# Patient Record
Sex: Female | Born: 1965 | Race: Black or African American | Hispanic: No | Marital: Single | Smoking: Never smoker
Health system: Southern US, Community
[De-identification: ages and names within clinical notes are randomized; demographics above are authoritative.]

## PROBLEM LIST (undated history)

## (undated) DIAGNOSIS — D649 Anemia, unspecified: Secondary | ICD-10-CM

## (undated) DIAGNOSIS — Z923 Personal history of irradiation: Secondary | ICD-10-CM

## (undated) DIAGNOSIS — D259 Leiomyoma of uterus, unspecified: Secondary | ICD-10-CM

## (undated) HISTORY — DX: Leiomyoma of uterus, unspecified: D25.9

## (undated) HISTORY — PX: DIAGNOSTIC LAPAROSCOPY: SUR761

## (undated) HISTORY — PX: BREAST LUMPECTOMY: SHX2

---

## 2001-08-06 HISTORY — PX: UTERINE FIBROID SURGERY: SHX826

## 2006-10-24 ENCOUNTER — Encounter: Admission: RE | Admit: 2006-10-24 | Discharge: 2006-10-24 | Payer: Self-pay | Admitting: Obstetrics and Gynecology

## 2007-07-17 ENCOUNTER — Ambulatory Visit: Payer: Self-pay | Admitting: Family Medicine

## 2007-07-17 ENCOUNTER — Other Ambulatory Visit: Admission: RE | Admit: 2007-07-17 | Discharge: 2007-07-17 | Payer: Self-pay | Admitting: Family Medicine

## 2007-07-17 ENCOUNTER — Encounter: Payer: Self-pay | Admitting: Family Medicine

## 2007-07-18 ENCOUNTER — Encounter: Payer: Self-pay | Admitting: Family Medicine

## 2007-07-18 LAB — CONVERTED CEMR LAB
ALT: 9 units/L (ref 0–35)
AST: 14 units/L (ref 0–37)
Albumin: 4.5 g/dL (ref 3.5–5.2)
Alkaline Phosphatase: 51 units/L (ref 39–117)
BUN: 15 mg/dL (ref 6–23)
HDL: 41 mg/dL (ref >39)
LDL Cholesterol: 86 mg/dL (ref 0–99)
Potassium: 4.6 meq/L (ref 3.5–5.3)
TSH: 0.898 microintl units/mL (ref 0.350–5.50)
Total CHOL/HDL Ratio: 3.5

## 2007-07-19 ENCOUNTER — Encounter: Payer: Self-pay | Admitting: Family Medicine

## 2007-07-23 LAB — CONVERTED CEMR LAB: Pap Smear: NORMAL

## 2007-09-30 ENCOUNTER — Encounter: Payer: Self-pay | Admitting: Family Medicine

## 2007-11-12 ENCOUNTER — Encounter: Admission: RE | Admit: 2007-11-12 | Discharge: 2007-11-12 | Payer: Self-pay | Admitting: Family Medicine

## 2008-11-16 ENCOUNTER — Encounter: Admission: RE | Admit: 2008-11-16 | Discharge: 2008-11-16 | Payer: Self-pay | Admitting: Family Medicine

## 2009-12-01 ENCOUNTER — Encounter: Admission: RE | Admit: 2009-12-01 | Discharge: 2009-12-01 | Payer: Self-pay | Admitting: Family Medicine

## 2010-11-01 ENCOUNTER — Encounter: Payer: Self-pay | Admitting: Family Medicine

## 2010-11-01 ENCOUNTER — Other Ambulatory Visit: Payer: Self-pay | Admitting: Family Medicine

## 2010-11-01 DIAGNOSIS — Z1231 Encounter for screening mammogram for malignant neoplasm of breast: Secondary | ICD-10-CM

## 2010-11-10 ENCOUNTER — Encounter: Payer: Self-pay | Admitting: Family Medicine

## 2010-11-10 ENCOUNTER — Ambulatory Visit (INDEPENDENT_AMBULATORY_CARE_PROVIDER_SITE_OTHER): Payer: 59 | Admitting: Family Medicine

## 2010-11-10 ENCOUNTER — Other Ambulatory Visit (HOSPITAL_COMMUNITY)
Admission: RE | Admit: 2010-11-10 | Discharge: 2010-11-10 | Disposition: A | Discharge status: Home / Self Care | Payer: 59 | Source: Ambulatory Visit | Attending: Family Medicine | Admitting: Family Medicine

## 2010-11-10 VITALS — BP 100/57 | HR 67 | Wt 143.0 lb

## 2010-11-10 DIAGNOSIS — Z862 Personal history of diseases of the blood and blood-forming organs and certain disorders involving the immune mechanism: Secondary | ICD-10-CM

## 2010-11-10 DIAGNOSIS — Z01419 Encounter for gynecological examination (general) (routine) without abnormal findings: Secondary | ICD-10-CM | POA: Insufficient documentation

## 2010-11-10 DIAGNOSIS — Z8639 Personal history of other endocrine, nutritional and metabolic disease: Secondary | ICD-10-CM

## 2010-11-10 DIAGNOSIS — Z23 Encounter for immunization: Secondary | ICD-10-CM

## 2010-11-10 DIAGNOSIS — Z1159 Encounter for screening for other viral diseases: Secondary | ICD-10-CM | POA: Insufficient documentation

## 2010-11-10 NOTE — Progress Notes (Signed)
  Subjective:     Jasmine Good is a 45 y.o. female and is here for a comprehensive physical exam. The patient reports no problems. Has mammo scheduled 12/06/2010.   History   Social History  . Marital Status: Single    Spouse Name: N/A    Number of Children: N/A  . Years of Education: N/A   Occupational History  . 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 Smoking status: Not on file  . Smokeless tobacco: Not on file  . Alcohol Use: Not on file  . Drug Use: Not on file  . Sexually Active: Not Currently   Other Topics Concern  . Not on file   Social History Narrative   No regular exercise.    Health Maintenance  Topic Date Due  . Influenza Vaccine  11/07/2010  . Pap Smear  11/09/2013  . Tetanus/tdap  07/16/2017    The following portions of the patient's history were reviewed and updated as appropriate: allergies, current medications, past family history, past medical history, past social history and past surgical history.  Review of Systems A comprehensive review of systems was negative.   Objective:    BP 100/57  Pulse 67  Wt 143 lb (64.864 kg) General appearance: alert, cooperative and appears stated age Head: Normocephalic, without obvious abnormality, atraumatic Eyes: conjunctiva clear, EOMI, PEERLA Ears: normal TM's and external ear canals both ears Nose: Nares normal. Septum midline. Mucosa normal. No drainage or sinus tenderness. Throat: lips, mucosa, and tongue normal; teeth and gums normal Neck: no adenopathy, no carotid bruit, supple, symmetrical, trachea midline and thyroid not enlarged, symmetric, no tenderness/mass/nodules Back: symmetric, no curvature. ROM normal. No CVA tenderness. Lungs: clear to auscultation bilaterally Breasts: normal appearance, no masses or tenderness Heart: regular rate and rhythm, S1, S2 normal, no murmur, click, rub or gallop Abdomen: soft, non-tender; bowel sounds normal; no masses,  no  organomegaly Pelvic: cervix normal in appearance, external genitalia normal, no adnexal masses or tenderness, no cervical motion tenderness, rectovaginal septum normal, uterus normal size, shape, and consistency and vagina normal without discharge Extremities: extremities normal, atraumatic, no cyanosis or edema Pulses: 2+ and symmetric Skin: Skin color, texture, turgor normal. No rashes or lesions Lymph nodes: Cervical, supraclavicular, and axillary nodes normal. Neurologic: Grossly normal    Assessment:    Healthy female exam.      Plan:     See After Visit Summary for Counseling Recommendations  Start a regular exercise program and make sure you are eating a healthy diet Try to eat 4 servings of dairy a day or take a calcium supplement (500mg  twice a day). Your vaccines are up to date.  Given lab slip for screening labs Given flu shot today. Prior hx of anemia so will check CBC as well.  Will call with pap results.

## 2010-11-10 NOTE — Patient Instructions (Signed)
Start a regular exercise program and make sure you are eating a healthy diet Try to eat 4 servings of dairy a day or take a calcium supplement (500mg twice a day). Your vaccines are up to date.   

## 2010-11-11 ENCOUNTER — Telehealth: Payer: Self-pay | Admitting: *Deleted

## 2010-11-11 LAB — COMPLETE METABOLIC PANEL WITH GFR
AST: 18 U/L (ref 0–37)
Alkaline Phosphatase: 46 U/L (ref 39–117)
BUN: 11 mg/dL (ref 6–23)
Creat: 0.55 mg/dL (ref 0.50–1.10)
Potassium: 4.3 mEq/L (ref 3.5–5.3)

## 2010-11-11 LAB — LIPID PANEL
Cholesterol: 148 mg/dL (ref 0–200)
Total CHOL/HDL Ratio: 3.4 Ratio
Triglycerides: 62 mg/dL (ref <150)
VLDL: 12 mg/dL (ref 0–40)

## 2010-11-11 NOTE — Telephone Encounter (Signed)
Message copied by Wyline Beady on Fri Nov 11, 2010  9:04 AM ------      Message from: Nani Gasser D      Created: Fri Nov 11, 2010  7:42 AM       Complete metabolic panel and cholesterol looked great. Iron also is normal. Hemoglobin is normal.

## 2010-11-11 NOTE — Telephone Encounter (Signed)
Left message on vm

## 2010-12-06 ENCOUNTER — Ambulatory Visit
Admission: RE | Admit: 2010-12-06 | Discharge: 2010-12-06 | Disposition: A | Discharge status: Home / Self Care | Payer: 59 | Source: Ambulatory Visit | Attending: Family Medicine | Admitting: Family Medicine

## 2010-12-06 DIAGNOSIS — Z1231 Encounter for screening mammogram for malignant neoplasm of breast: Secondary | ICD-10-CM

## 2011-11-07 ENCOUNTER — Other Ambulatory Visit: Payer: Self-pay | Admitting: Family Medicine

## 2011-11-07 DIAGNOSIS — Z9289 Personal history of other medical treatment: Secondary | ICD-10-CM

## 2011-11-09 ENCOUNTER — Ambulatory Visit (INDEPENDENT_AMBULATORY_CARE_PROVIDER_SITE_OTHER): Payer: 59

## 2011-11-09 DIAGNOSIS — Z9289 Personal history of other medical treatment: Secondary | ICD-10-CM

## 2011-11-09 DIAGNOSIS — Z1231 Encounter for screening mammogram for malignant neoplasm of breast: Secondary | ICD-10-CM

## 2011-12-07 ENCOUNTER — Encounter: Payer: Self-pay | Admitting: Family Medicine

## 2011-12-07 ENCOUNTER — Ambulatory Visit (INDEPENDENT_AMBULATORY_CARE_PROVIDER_SITE_OTHER): Payer: 59 | Admitting: Family Medicine

## 2011-12-07 VITALS — BP 100/61 | HR 74 | Ht 66.5 in | Wt 145.0 lb

## 2011-12-07 DIAGNOSIS — Z0000 Encounter for general adult medical examination without abnormal findings: Secondary | ICD-10-CM

## 2011-12-07 NOTE — Patient Instructions (Addendum)
Start a regular exercise program and make sure you are eating a healthy diet Try to eat 4 servings of dairy a day  Your vaccines are up to date.   

## 2011-12-07 NOTE — Progress Notes (Signed)
  Subjective:     Jasmine Good is a 46 y.o. female and is here for a comprehensive physical exam. The patient reports no problems.  History   Social History  . Marital Status: Single    Spouse Name: N/A    Number of Children: N/A  . Years of Education: N/A   Occupational History  . 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 Smoking status: Never Smoker   . Smokeless tobacco: Not on file  . Alcohol Use: Not on file  . Drug Use: Not on file  . Sexually Active: Not Currently   Other Topics Concern  . Not on file   Social History Narrative   No regular exercise.    Health Maintenance  Topic Date Due  . Influenza Vaccine  10/07/2012  . Pap Smear  11/09/2013  . Tetanus/tdap  07/16/2017    The following portions of the patient's history were reviewed and updated as appropriate: allergies, current medications, past family history, past medical history, past social history, past surgical history and problem list.  Review of Systems A comprehensive review of systems was negative.   Objective:    BP 100/61  Pulse 74  Ht 5' 6.5" (1.689 m)  Wt 145 lb (65.772 kg)  BMI 23.05 kg/m2  LMP 11/03/2011 General appearance: alert, cooperative and appears stated age Head: Normocephalic, without obvious abnormality, atraumatic Eyes: conj clear, EOMI, PEERLA Ears: normal TM's and external ear canals both ears Nose: Nares normal. Septum midline. Mucosa normal. No drainage or sinus tenderness. Throat: lips, mucosa, and tongue normal; teeth and gums normal Neck: no adenopathy, no carotid bruit, no JVD, supple, symmetrical, trachea midline and thyroid not enlarged, symmetric, no tenderness/mass/nodules Back: symmetric, no curvature. ROM normal. No CVA tenderness. Lungs: clear to auscultation bilaterally Heart: regular rate and rhythm, S1, S2 normal, no murmur, click, rub or gallop Abdomen: soft, non-tender; bowel sounds normal; no masses,  no  organomegaly Extremities: extremities normal, atraumatic, no cyanosis or edema Pulses: 2+ and symmetric Skin: Skin color, texture, turgor normal. No rashes or lesions Lymph nodes: Cervical, supraclavicular, and axillary nodes normal. Neurologic: Alert and oriented X 3, normal strength and tone. Normal symmetric reflexes. Normal coordination and gait    Assessment:    Healthy female exam.      Plan:     See After Visit Summary for Counseling Recommendations  Start a regular exercise program and make sure you are eating a healthy diet Try to eat 4 servings of dairy a day Your vaccines are up to date.  Given slip for CMP and lipids.   Mammogram is up to date.

## 2011-12-08 LAB — LIPID PANEL
Cholesterol: 149 mg/dL (ref 0–200)
HDL: 43 mg/dL (ref >39)
Triglycerides: 43 mg/dL (ref <150)

## 2011-12-08 LAB — COMPLETE METABOLIC PANEL WITH GFR
BUN: 16 mg/dL (ref 6–23)
CO2: 26 mEq/L (ref 19–32)
Creat: 0.48 mg/dL — ABNORMAL LOW (ref 0.50–1.10)
GFR, Est African American: >89 mL/min
GFR, Est Non African American: >89 mL/min
Glucose, Bld: 77 mg/dL (ref 70–99)
Total Bilirubin: 0.4 mg/dL (ref 0.3–1.2)

## 2011-12-08 NOTE — Progress Notes (Signed)
Quick Note:  All labs are normal. ______ 

## 2012-11-04 ENCOUNTER — Other Ambulatory Visit: Payer: Self-pay | Admitting: Family Medicine

## 2012-11-04 DIAGNOSIS — Z1231 Encounter for screening mammogram for malignant neoplasm of breast: Secondary | ICD-10-CM

## 2012-11-12 ENCOUNTER — Ambulatory Visit (INDEPENDENT_AMBULATORY_CARE_PROVIDER_SITE_OTHER): Payer: 59

## 2012-11-12 DIAGNOSIS — Z1231 Encounter for screening mammogram for malignant neoplasm of breast: Secondary | ICD-10-CM

## 2013-01-06 ENCOUNTER — Encounter: Payer: Self-pay | Admitting: Family Medicine

## 2013-01-06 ENCOUNTER — Ambulatory Visit (INDEPENDENT_AMBULATORY_CARE_PROVIDER_SITE_OTHER): Payer: 59 | Admitting: Family Medicine

## 2013-01-06 VITALS — BP 105/62 | HR 85 | Temp 97.6°F | Ht 66.6 in | Wt 147.0 lb

## 2013-01-06 DIAGNOSIS — Z23 Encounter for immunization: Secondary | ICD-10-CM

## 2013-01-06 DIAGNOSIS — Z0000 Encounter for general adult medical examination without abnormal findings: Secondary | ICD-10-CM

## 2013-01-06 LAB — COMPLETE METABOLIC PANEL WITH GFR
CO2: 25 mEq/L (ref 19–32)
GFR, Est African American: >89 mL/min
GFR, Est Non African American: >89 mL/min
Glucose, Bld: 86 mg/dL (ref 70–99)
Sodium: 136 mEq/L (ref 135–145)
Total Bilirubin: 0.4 mg/dL (ref 0.3–1.2)
Total Protein: 6.7 g/dL (ref 6.0–8.3)

## 2013-01-06 LAB — LIPID PANEL: HDL: 41 mg/dL (ref >39)

## 2013-01-06 NOTE — Progress Notes (Signed)
  Subjective:     Jasmine Good is a 46 y.o. female and is here for a comprehensive physical exam. The patient reports no problems.  History   Social History  . Marital Status: Single    Spouse Name: N/A    Number of Children: 0  . Years of Education: N/A   Occupational History  . 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 Smoking status: Never Smoker   . Smokeless tobacco: Not on file  . Alcohol Use: Not on file  . Drug Use: Not on file  . Sexual Activity: Not Currently   Other Topics Concern  . Not on file   Social History Narrative   Some regular exercise.    Health Maintenance  Topic Date Due  . Influenza Vaccine  09/06/2013  . Pap Smear  11/09/2013  . Tetanus/tdap  07/16/2017    The following portions of the patient's history were reviewed and updated as appropriate: allergies, current medications, past family history, past medical history, past social history, past surgical history and problem list.  Review of Systems A comprehensive review of systems was negative.   Objective:    BP 105/62  Pulse 85  Temp(Src) 97.6 F (36.4 C)  Ht 5' 6.6" (1.692 m)  Wt 147 lb (66.679 kg)  BMI 23.29 kg/m2 General appearance: alert, cooperative and appears stated age Head: Normocephalic, without obvious abnormality, atraumatic Eyes: negative, conj clear, EOMi, PEERLA Ears: normal TM's and external ear canals both ears Nose: Nares normal. Septum midline. Mucosa normal. No drainage or sinus tenderness. Throat: lips, mucosa, and tongue normal; teeth and gums normal Neck: no adenopathy, no carotid bruit, no JVD, supple, symmetrical, trachea midline and thyroid not enlarged, symmetric, no tenderness/mass/nodules Back: symmetric, no curvature. ROM normal. No CVA tenderness. Lungs: clear to auscultation bilaterally Breasts: normal appearance, no masses or tenderness Heart: regular rate and rhythm, S1, S2 normal, no murmur, click, rub or  gallop Abdomen: soft, non-tender; bowel sounds normal; no masses,  no organomegaly Extremities: extremities normal, atraumatic, no cyanosis or edema Pulses: 2+ and symmetric Skin: Skin color, texture, turgor normal. No rashes or lesions Lymph nodes: Cervical, supraclavicular, and axillary nodes normal. Neurologic: Alert and oriented X 3, normal strength and tone. Normal symmetric reflexes. Normal coordination and gait    Assessment:    Healthy female exam.      Plan:     See After Visit Summary for Counseling Recommendations  Keep up a regular exercise program and make sure you are eating a healthy diet Try to eat 4 servings of dairy a day, or if you are lactose intolerant take a calcium with vitamin D daily.  Your vaccines are up to date.  Has had mammogram done this year. Tdap today. Flu vaccine given today. Due for screening CMP and lipid panel today. Lab slip given she will go this morning.

## 2013-01-06 NOTE — Patient Instructions (Signed)
Keep up a regular exercise program and make sure you are eating a healthy diet Try to eat 4 servings of dairy a day, or if you are lactose intolerant take a calcium with vitamin D daily.  Your vaccines are up to date.   

## 2013-01-07 NOTE — Progress Notes (Signed)
Quick Note:  All labs are normal. ______ 

## 2013-11-03 ENCOUNTER — Other Ambulatory Visit: Payer: Self-pay | Admitting: Family Medicine

## 2013-11-03 DIAGNOSIS — Z1231 Encounter for screening mammogram for malignant neoplasm of breast: Secondary | ICD-10-CM

## 2013-11-13 ENCOUNTER — Ambulatory Visit (INDEPENDENT_AMBULATORY_CARE_PROVIDER_SITE_OTHER): Payer: 59

## 2013-11-13 DIAGNOSIS — Z1231 Encounter for screening mammogram for malignant neoplasm of breast: Secondary | ICD-10-CM

## 2014-02-19 ENCOUNTER — Other Ambulatory Visit (HOSPITAL_COMMUNITY)
Admission: RE | Admit: 2014-02-19 | Discharge: 2014-02-19 | Disposition: A | Discharge status: Home / Self Care | Payer: 59 | Source: Ambulatory Visit | Attending: Family Medicine | Admitting: Family Medicine

## 2014-02-19 ENCOUNTER — Encounter: Payer: Self-pay | Admitting: Family Medicine

## 2014-02-19 ENCOUNTER — Ambulatory Visit (INDEPENDENT_AMBULATORY_CARE_PROVIDER_SITE_OTHER): Payer: 59 | Admitting: Family Medicine

## 2014-02-19 VITALS — BP 103/59 | HR 73 | Temp 97.9°F | Ht 65.5 in | Wt 140.0 lb

## 2014-02-19 DIAGNOSIS — Z01419 Encounter for gynecological examination (general) (routine) without abnormal findings: Secondary | ICD-10-CM | POA: Insufficient documentation

## 2014-02-19 DIAGNOSIS — Z1151 Encounter for screening for human papillomavirus (HPV): Secondary | ICD-10-CM | POA: Diagnosis present

## 2014-02-19 LAB — LIPID PANEL
CHOL/HDL RATIO: 3.3 ratio
Cholesterol: 147 mg/dL (ref 0–200)
HDL: 44 mg/dL (ref >39)
LDL Cholesterol: 91 mg/dL (ref 0–99)
Triglycerides: 58 mg/dL (ref <150)
VLDL: 12 mg/dL (ref 0–40)

## 2014-02-19 LAB — COMPLETE METABOLIC PANEL WITH GFR
ALK PHOS: 49 U/L (ref 39–117)
ALT: 9 U/L (ref 0–35)
AST: 14 U/L (ref 0–37)
Albumin: 4.3 g/dL (ref 3.5–5.2)
BILIRUBIN TOTAL: 0.4 mg/dL (ref 0.2–1.2)
BUN: 14 mg/dL (ref 6–23)
CALCIUM: 9.4 mg/dL (ref 8.4–10.5)
CO2: 27 mEq/L (ref 19–32)
CREATININE: 0.53 mg/dL (ref 0.50–1.10)
Chloride: 103 mEq/L (ref 96–112)
GFR, Est Non African American: >89 mL/min
Glucose, Bld: 86 mg/dL (ref 70–99)
POTASSIUM: 4.6 meq/L (ref 3.5–5.3)
SODIUM: 138 meq/L (ref 135–145)
TOTAL PROTEIN: 7 g/dL (ref 6.0–8.3)

## 2014-02-19 LAB — HEMOGLOBIN A1C
Hgb A1c MFr Bld: 5.7 % — ABNORMAL HIGH
Mean Plasma Glucose: 117 mg/dL — ABNORMAL HIGH

## 2014-02-19 NOTE — Progress Notes (Signed)
  Subjective:     Jasmine Good is a 49 y.o. female and is here for a comprehensive physical exam. The patient reports no problems. She did have an A1c done at work through a health screening in September. Her A1c was 5.6. She is concerned about the cemented discussed this today. She says since then she has really cut back on sweets tea intake.  History   Social History  . Marital Status: Single    Spouse Name: N/A    Number of Children: 0  . Years of Education: N/A   Occupational History  . 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 Smoking status: Never Smoker   . Smokeless tobacco: Not on file  . Alcohol Use: Not on file  . Drug Use: Not on file  . Sexual Activity: Not Currently   Other Topics Concern  . Not on file   Social History Narrative   Some regular exercise.    Health Maintenance  Topic Date Due  . HIV Screening  03/05/1981  . INFLUENZA VACCINE  09/06/2013  . PAP SMEAR  11/09/2013  . TETANUS/TDAP  07/16/2017    The following portions of the patient's history were reviewed and updated as appropriate: allergies, current medications, past family history, past medical history, past social history, past surgical history and problem list.  Review of Systems A comprehensive review of systems was negative.   Objective:    BP 103/59 mmHg  Pulse 73  Temp(Src) 97.9 F (36.6 C)  Ht 5' 5.5" (1.664 m)  Wt 140 lb (63.504 kg)  BMI 22.93 kg/m2  SpO2 100% General appearance: alert, cooperative and appears stated age Head: Normocephalic, without obvious abnormality, atraumatic Eyes: conj clear, EOMI, PEERA Ears: normal TM's and external ear canals both ears Nose: Nares normal. Septum midline. Mucosa normal. No drainage or sinus tenderness. Throat: lips, mucosa, and tongue normal; teeth and gums normal Neck: no adenopathy, no carotid bruit, no JVD, supple, symmetrical, trachea midline and thyroid not enlarged, symmetric, no  tenderness/mass/nodules Back: symmetric, no curvature. ROM normal. No CVA tenderness. Lungs: clear to auscultation bilaterally Breasts: normal appearance, no masses or tenderness Heart: regular rate and rhythm, S1, S2 normal, no murmur, click, rub or gallop Abdomen: soft, non-tender; bowel sounds normal; no masses,  no organomegaly Pelvic: cervix normal in appearance, external genitalia normal, no adnexal masses or tenderness, no cervical motion tenderness, rectovaginal septum normal, uterus normal size, shape, and consistency and vagina normal without discharge Extremities: extremities normal, atraumatic, no cyanosis or edema Pulses: 2+ and symmetric Skin: Skin color, texture, turgor normal. No rashes or lesions Lymph nodes: Cervical, supraclavicular, and axillary nodes normal. Neurologic: Alert and oriented X 3, normal strength and tone. Normal symmetric reflexes. Normal coordination and gait    Assessment:    Healthy female exam.      Plan:     See After Visit Summary for Counseling Recommendations  Keep up a regular exercise program and make sure you are eating a healthy diet Try to eat 4 servings of dairy a day, or if you are lactose intolerant take a calcium with vitamin D daily.  Your vaccines are up to date.  a

## 2014-02-19 NOTE — Patient Instructions (Signed)
Keep up a regular exercise program and make sure you are eating a healthy diet Try to eat 4 servings of dairy a day, or if you are lactose intolerant take a calcium with vitamin D daily.  Your vaccines are up to date.   

## 2014-02-20 LAB — CYTOLOGY - PAP

## 2014-02-20 LAB — HIV ANTIBODY (ROUTINE TESTING W REFLEX): HIV 1&2 Ab, 4th Generation: NONREACTIVE

## 2014-02-24 NOTE — Progress Notes (Signed)
Quick Note:  Call patient: Your Pap smear is normal. Repeat in 5 years. ______ 

## 2014-11-09 ENCOUNTER — Other Ambulatory Visit: Payer: Self-pay | Admitting: Family Medicine

## 2014-11-09 DIAGNOSIS — Z1231 Encounter for screening mammogram for malignant neoplasm of breast: Secondary | ICD-10-CM

## 2014-11-18 ENCOUNTER — Ambulatory Visit: Payer: 59

## 2014-11-19 ENCOUNTER — Ambulatory Visit (HOSPITAL_BASED_OUTPATIENT_CLINIC_OR_DEPARTMENT_OTHER)
Admission: RE | Admit: 2014-11-19 | Discharge: 2014-11-19 | Disposition: A | Discharge status: Home / Self Care | Payer: 59 | Source: Ambulatory Visit | Attending: Family Medicine | Admitting: Family Medicine

## 2014-11-19 DIAGNOSIS — Z1231 Encounter for screening mammogram for malignant neoplasm of breast: Secondary | ICD-10-CM | POA: Diagnosis present

## 2015-03-02 ENCOUNTER — Ambulatory Visit (INDEPENDENT_AMBULATORY_CARE_PROVIDER_SITE_OTHER): Payer: 59 | Admitting: Family Medicine

## 2015-03-02 ENCOUNTER — Encounter: Payer: Self-pay | Admitting: Family Medicine

## 2015-03-02 VITALS — BP 119/56 | HR 85 | Wt 134.0 lb

## 2015-03-02 DIAGNOSIS — R7301 Impaired fasting glucose: Secondary | ICD-10-CM | POA: Diagnosis not present

## 2015-03-02 DIAGNOSIS — Z0000 Encounter for general adult medical examination without abnormal findings: Secondary | ICD-10-CM | POA: Diagnosis not present

## 2015-03-02 DIAGNOSIS — Z23 Encounter for immunization: Secondary | ICD-10-CM | POA: Diagnosis not present

## 2015-03-02 LAB — POCT GLYCOSYLATED HEMOGLOBIN (HGB A1C): Hemoglobin A1C: 6.3

## 2015-03-02 MED ORDER — METFORMIN HCL 500 MG PO TABS: 500.0000 mg | ORAL_TABLET | Freq: Every day | ORAL | Status: DC

## 2015-03-02 NOTE — Addendum Note (Signed)
Addended by: Huel Cote on: 03/02/2015 03:28 PM   Modules accepted: Orders

## 2015-03-02 NOTE — Addendum Note (Signed)
Addended by: Beatrice Lecher D on: 03/02/2015 03:45 PM   Modules accepted: Orders

## 2015-03-02 NOTE — Patient Instructions (Signed)
Keep up a regular exercise program and make sure you are eating a healthy diet Try to eat 4 servings of dairy a day, or if you are lactose intolerant take a calcium with vitamin D daily.  Your vaccines are up to date.   

## 2015-03-02 NOTE — Progress Notes (Signed)
  Subjective:     Jasmine Good is a 50 y.o. female and is here for a comprehensive physical exam. The patient reports no problems.  Social History   Social History  . Marital Status: Single    Spouse Name: N/A  . Number of Children: 0  . Years of Education: N/A   Occupational History  . 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 Smoking status: Never Smoker   . Smokeless tobacco: Not on file  . Alcohol Use: Not on file  . Drug Use: Not on file  . Sexual Activity: Not Currently   Other Topics Concern  . Not on file   Social History Narrative   Some regular exercise.    Health Maintenance  Topic Date Due  . INFLUENZA VACCINE  09/07/2014  . PAP SMEAR  02/19/2017  . TETANUS/TDAP  07/16/2017  . HIV Screening  Completed    The following portions of the patient's history were reviewed and updated as appropriate: allergies, current medications, past family history, past medical history, past social history, past surgical history and problem list.  Review of Systems A comprehensive review of systems was negative.   Objective:    BP 119/56 mmHg  Pulse 85  Wt 134 lb (60.782 kg)  SpO2 100% General appearance: alert, cooperative and appears stated age Head: Normocephalic, without obvious abnormality, atraumatic Eyes: conj clear, EOMI, pEERLA Ears: normal TM's and external ear canals both ears Nose: Nares normal. Septum midline. Mucosa normal. No drainage or sinus tenderness. Throat: lips, mucosa, and tongue normal; teeth and gums normal Neck: no adenopathy, no carotid bruit, no JVD, supple, symmetrical, trachea midline and thyroid not enlarged, symmetric, no tenderness/mass/nodules Back: symmetric, no curvature. ROM normal. No CVA tenderness. Lungs: clear to auscultation bilaterally Breasts: normal appearance, no masses or tenderness Heart: regular rate and rhythm, S1, S2 normal, no murmur, click, rub or gallop Abdomen: soft, non-tender;  bowel sounds normal; no masses,  no organomegaly Extremities: extremities normal, atraumatic, no cyanosis or edema Pulses: 2+ and symmetric Skin: Skin color, texture, turgor normal. No rashes or lesions Lymph nodes: Cervical, supraclavicular, and axillary nodes normal. Neurologic: Alert and oriented X 3, normal strength and tone. Normal symmetric reflexes. Normal coordination and gait    Assessment:    Healthy female exam.      Plan:     See After Visit Summary for Counseling Recommendations  Keep up a regular exercise program and make sure you are eating a healthy diet Try to eat 4 servings of dairy a day, or if you are lactose intolerant take a calcium with vitamin D daily.  Your vaccines are up to date.  Mammogram is UTD.   Given flu vaccine today.

## 2015-03-09 ENCOUNTER — Telehealth: Payer: Self-pay

## 2015-03-09 DIAGNOSIS — R7301 Impaired fasting glucose: Secondary | ICD-10-CM

## 2015-03-09 NOTE — Telephone Encounter (Signed)
Patient wanted a referral to a Nutritionist. Referral placed.

## 2015-03-09 NOTE — Telephone Encounter (Signed)
Ok for referral?

## 2015-03-10 ENCOUNTER — Encounter: Payer: Self-pay | Admitting: Family Medicine

## 2015-03-22 ENCOUNTER — Telehealth: Payer: Self-pay

## 2015-03-22 DIAGNOSIS — R7303 Prediabetes: Secondary | ICD-10-CM

## 2015-03-22 NOTE — Telephone Encounter (Signed)
Ok to place referral.

## 2015-03-22 NOTE — Telephone Encounter (Signed)
Patient called and states she would like to see an Endocrinologist for her prediabetes. Please advise.

## 2015-03-23 NOTE — Telephone Encounter (Signed)
referral placed

## 2015-04-05 ENCOUNTER — Encounter: Payer: Self-pay | Admitting: Skilled Nursing Facility1

## 2015-04-05 ENCOUNTER — Encounter: Payer: 59 | Attending: Family Medicine | Admitting: Skilled Nursing Facility1

## 2015-04-05 VITALS — Ht 65.0 in | Wt 127.0 lb

## 2015-04-05 DIAGNOSIS — R7303 Prediabetes: Secondary | ICD-10-CM

## 2015-04-05 DIAGNOSIS — Z029 Encounter for administrative examinations, unspecified: Secondary | ICD-10-CM | POA: Insufficient documentation

## 2015-04-05 NOTE — Progress Notes (Signed)
  Medical Nutrition Therapy:  Appt start time: 0900 end time:  1000.   Assessment:  Primary concerns today: referred for prediabetes. Pt states her A1C is 6.3. Pt states she weighed 134 pounds on January 24th and is concerned for her wt loss (7 pounds lost).  Pt states her energy and sleep are good. Pt states she has not had any hair loss and has had the same energy level during her workouts. Pt states she has been afraid to eat a variety of foods since her prediabetes diagnosis. Pt states she has never had any other medical diagnoses or medical issues and has been in the 130-140's all of her life and has been physically active. Pt states her father has type 2 diabetes and is managing it with lifestyle choices. Pt states she is confused about what to eat because the information is not consistant on the Internet.  Pt states she would like to be around 140 pounds.  Preferred Learning Style:   No preference indicated   Learning Readiness:   Ready  MEDICATIONS: metformin   DIETARY INTAKE:  Usual eating pattern includes 3 meals and 2-3 snacks per day.  Everyday foods include salad.  Avoided foods include most foods not listed below.    24-hr recall:  B ( AM): cereal, 2 [place] suasages Snk ( AM): rice cake L ( PM): salad with chicken and some fruit Snk ( PM): cashews and teddy grahams  D ( PM): chicken-----tilapia---brown rice, salad Snk ( PM): cashews----popcorn---teddy grams  Beverages: water, skim milk  Usual physical activity: 20 minute cardio 6 days a week  Estimated energy needs: 1800 calories 200 g carbohydrates 135 g protein 50 g fat  Progress Towards Goal(s):  In progress.   Nutritional Diagnosis:  -3.2 Unintentional weight loss As related to newly diagnosed prediabetes without proper nutrition education/newly prescribed metformin.  As evidenced by 7 pound wt loss in a month, pt report, and 24 hr recall..    Intervention:  Nutrition counseling for prediabetes.  Dietitian educated the pt on portion sizes, prediabetes, legitimate information sources, balance, and food variety. Goals: -Avoid fried foods -A meal: carbohydrate, protein, vegetable -A snack: carbohdydrate OR vegetable AND protein -You can eat anything you want -3 meals a day and 2-3 snacks (eating every 2-3 hours) -Try to make your workouts 30 minutes -Try to eat a snack an hour before your workout and eat breakfast an hour after your workout (Do not sacrifice sleep) -Drink 2% milk instead of skim -Weigh yourself every week to make sure you are maintaining or gaining-So 1-2 pounds a week is good -Get regular cheese  Teaching Method Utilized:  Visual Auditory Hands on  Handouts given during visit include:  Snack sheet  Barriers to learning/adherence to lifestyle change: her fear of eating the "wrong" foods  Demonstrated degree of understanding via:  Teach Back   Monitoring/Evaluation:  Dietary intake, exercise, A1C, and body weight prn.

## 2015-04-05 NOTE — Patient Instructions (Addendum)
-  Avoid fried foods -A meal: carbohydrate, protein, vegetable -A snack: carbohdydrate OR vegetable AND protein -You can eat anything you want -3 meals a day and 2-3 snacks (eating every 2-3 hours) -Try to make your workouts 30 minutes -Try to eat a snack an hour before your workout and eat breakfast an hour after your workout (Do not sacrifice sleep) -Drink 2% milk instead of skim -Weigh yourself every week to make sure you are maintaining or gaining-So 1-2 pounds a week is good -Get regular cheese

## 2015-04-26 ENCOUNTER — Telehealth: Payer: Self-pay

## 2015-04-26 NOTE — Telephone Encounter (Signed)
Notified patient of recommendations.

## 2015-04-26 NOTE — Telephone Encounter (Signed)
Okay to stop the medication. I think she sensitive nutrition counseling so we can just see how she does with diet and monitor it. If we still need to put her on something to help control the A1c and we can discuss it at her follow-up appointment and maybe try something different.

## 2015-08-30 ENCOUNTER — Ambulatory Visit (INDEPENDENT_AMBULATORY_CARE_PROVIDER_SITE_OTHER): Payer: 59 | Admitting: Family Medicine

## 2015-08-30 ENCOUNTER — Encounter: Payer: Self-pay | Admitting: Family Medicine

## 2015-08-30 VITALS — BP 99/50 | HR 68 | Wt 131.0 lb

## 2015-08-30 DIAGNOSIS — R7301 Impaired fasting glucose: Secondary | ICD-10-CM

## 2015-08-30 LAB — POCT GLYCOSYLATED HEMOGLOBIN (HGB A1C): Hemoglobin A1C: 6

## 2015-08-30 NOTE — Progress Notes (Signed)
Subjective:    CC: HTN, IFG   HPI: IFG - no inc thirst or urination. Last a1C was 6.3 . Off of metformin.  She is eating smaller more frequent meals. Says going to the nutritionist really helped. Says the metformin caused GI upset so decided to stop it after 6 weeks.   Lab Results  Component Value Date   HGBA1C 6.0 08/30/2015     Past medical history, Surgical history, Family history not pertinant except as noted below, Social history, Allergies, and medications have been entered into the medical record, reviewed, and corrections made.   Review of Systems: No fevers, chills, night sweats, weight loss, chest pain, or shortness of breath.   Objective:    General: Well Developed, well nourished, and in no acute distress.  Neuro: Alert and oriented x3, extra-ocular muscles intact, sensation grossly intact.  HEENT: Normocephalic, atraumatic  Skin: Warm and dry, no rashes. Cardiac: Regular rate and rhythm, no murmurs rubs or gallops, no lower extremity edema.  Respiratory: Clear to auscultation bilaterally. Not using accessory muscles, speaking in full sentences.   Impression and Recommendations:   IFG - improved since Jan.  At 6.0. F/U in 6 months. Continue to work on diet and exercise.

## 2015-11-08 ENCOUNTER — Other Ambulatory Visit: Payer: Self-pay | Admitting: Family Medicine

## 2015-11-08 DIAGNOSIS — Z1231 Encounter for screening mammogram for malignant neoplasm of breast: Secondary | ICD-10-CM

## 2015-11-22 ENCOUNTER — Ambulatory Visit (HOSPITAL_BASED_OUTPATIENT_CLINIC_OR_DEPARTMENT_OTHER)
Admission: RE | Admit: 2015-11-22 | Discharge: 2015-11-22 | Disposition: A | Discharge status: Home / Self Care | Payer: 59 | Source: Ambulatory Visit | Attending: Family Medicine | Admitting: Family Medicine

## 2015-11-22 DIAGNOSIS — Z1231 Encounter for screening mammogram for malignant neoplasm of breast: Secondary | ICD-10-CM | POA: Diagnosis present

## 2016-03-01 ENCOUNTER — Other Ambulatory Visit: Payer: Self-pay | Admitting: Family Medicine

## 2016-03-01 ENCOUNTER — Ambulatory Visit (INDEPENDENT_AMBULATORY_CARE_PROVIDER_SITE_OTHER): Payer: 59 | Admitting: Family Medicine

## 2016-03-01 ENCOUNTER — Encounter: Payer: Self-pay | Admitting: Family Medicine

## 2016-03-01 VITALS — BP 114/52 | HR 71 | Ht 65.0 in | Wt 130.0 lb

## 2016-03-01 DIAGNOSIS — R7301 Impaired fasting glucose: Secondary | ICD-10-CM

## 2016-03-01 DIAGNOSIS — Z0000 Encounter for general adult medical examination without abnormal findings: Secondary | ICD-10-CM | POA: Diagnosis not present

## 2016-03-01 DIAGNOSIS — Z23 Encounter for immunization: Secondary | ICD-10-CM

## 2016-03-01 LAB — COMPLETE METABOLIC PANEL WITH GFR
ALBUMIN: 4 g/dL (ref 3.6–5.1)
ALK PHOS: 47 U/L (ref 33–115)
ALT: 10 U/L (ref 6–29)
AST: 15 U/L (ref 10–35)
BUN: 16 mg/dL (ref 7–25)
CHLORIDE: 105 mmol/L (ref 98–110)
CO2: 24 mmol/L (ref 20–31)
Calcium: 9.2 mg/dL (ref 8.6–10.2)
Creat: 0.52 mg/dL (ref 0.50–1.10)
GFR, Est African American: >89 mL/min (ref >=60)
GLUCOSE: 83 mg/dL (ref 65–99)
POTASSIUM: 4.5 mmol/L (ref 3.5–5.3)
SODIUM: 137 mmol/L (ref 135–146)
Total Bilirubin: 0.6 mg/dL (ref 0.2–1.2)
Total Protein: 7 g/dL (ref 6.1–8.1)

## 2016-03-01 LAB — CBC
HCT: 29.2 % — ABNORMAL LOW (ref 35.0–45.0)
HEMOGLOBIN: 9.2 g/dL — AB (ref 11.7–15.5)
MCH: 23.4 pg — AB (ref 27.0–33.0)
MCHC: 31.5 g/dL — ABNORMAL LOW (ref 32.0–36.0)
MCV: 74.3 fL — ABNORMAL LOW (ref 80.0–100.0)
MPV: 9.2 fL (ref 7.5–12.5)
Platelets: 342 10*3/uL (ref 140–400)
RBC: 3.93 MIL/uL (ref 3.80–5.10)
RDW: 15.8 % — ABNORMAL HIGH (ref 11.0–15.0)
WBC: 3.5 10*3/uL — AB (ref 3.8–10.8)

## 2016-03-01 LAB — LIPID PANEL
CHOL/HDL RATIO: 2.4 ratio (ref <5.0)
CHOLESTEROL: 161 mg/dL (ref <200)
HDL: 67 mg/dL (ref >50)
LDL Cholesterol: 87 mg/dL (ref <100)
Triglycerides: 37 mg/dL (ref <150)
VLDL: 7 mg/dL (ref <30)

## 2016-03-01 LAB — POCT GLYCOSYLATED HEMOGLOBIN (HGB A1C): Hemoglobin A1C: 5.9

## 2016-03-01 NOTE — Patient Instructions (Signed)
Keep up a regular exercise program and make sure you are eating a healthy diet Try to eat 4 servings of dairy a day, or if you are lactose intolerant take a calcium with vitamin D daily.  Your vaccines are up to date.   

## 2016-03-01 NOTE — Progress Notes (Signed)
Subjective:     Jasmine Good is a 51 y.o. female and is here for a comprehensive physical exam. The patient reports no problems.She is doing well overall. No specific complaints. She's been working out 5 days a week. Her goal is to try to bring her A1c down. She is okay with receiving the flu shot today.  Social History   Social History  . Marital status: Single    Spouse name: N/A  . Number of children: 0  . Years of education: N/A   Occupational History  . 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 Smoking status: Never Smoker  . Smokeless tobacco: Not on file  . Alcohol use No  . Drug use: No  . Sexual activity: Not Currently   Other Topics Concern  . Not on file   Social History Narrative    regular exercise. No regular caffeine intake.     Health Maintenance  Topic Date Due  . INFLUENZA VACCINE  09/07/2015  . PAP SMEAR  02/19/2017  . TETANUS/TDAP  07/16/2017  . HIV Screening  Completed    The following portions of the patient's history were reviewed and updated as appropriate: allergies, current medications, past family history, past medical history, past social history, past surgical history and problem list.  Review of Systems A comprehensive review of systems was negative.    Objective:    Ht 5\' 5"  (1.651 m)  General appearance: alert, cooperative and appears stated age Head: Normocephalic, without obvious abnormality, atraumatic Eyes: conj clear, EOMI, PEERLA Ears: normal TM's and external ear canals both ears Nose: Nares normal. Septum midline. Mucosa normal. No drainage or sinus tenderness. Throat: lips, mucosa, and tongue normal; teeth and gums normal Neck: no adenopathy, no carotid bruit, no JVD, supple, symmetrical, trachea midline and thyroid not enlarged, symmetric, no tenderness/mass/nodules Back: symmetric, no curvature. ROM normal. No CVA tenderness. Lungs: clear to auscultation bilaterally Breasts: normal  appearance, no masses or tenderness Heart: regular rate and rhythm, S1, S2 normal, no murmur, click, rub or gallop Abdomen: soft, non-tender; bowel sounds normal; no masses,  no organomegaly Extremities: extremities normal, atraumatic, no cyanosis or edema Pulses: 2+ and symmetric Skin: Skin color, texture, turgor normal. No rashes or lesions Lymph nodes: Cervical, supraclavicular, and axillary nodes normal. Neurologic: Alert and oriented X 3, normal strength and tone. Normal symmetric reflexes. Normal coordination and gait    Assessment:    Healthy female exam     Plan:     See After Visit Summary for Counseling Recommendations   Keep up a regular exercise program and make sure you are eating a healthy diet Try to eat 4 servings of dairy a day, or if you are lactose intolerant take a calcium with vitamin D daily.  Your vaccines are up to date.  Flu vaccine given.   IFG - A1C of 5.9.  Improved from previous.

## 2016-03-06 LAB — FERRITIN: FERRITIN: 3 ng/mL — AB (ref 10–232)

## 2016-03-06 LAB — VITAMIN B12: Vitamin B-12: 679 pg/mL (ref 200–1100)

## 2016-04-06 ENCOUNTER — Ambulatory Visit (INDEPENDENT_AMBULATORY_CARE_PROVIDER_SITE_OTHER): Payer: 59 | Admitting: Family Medicine

## 2016-04-06 VITALS — BP 96/52 | HR 67 | Wt 136.0 lb

## 2016-04-06 DIAGNOSIS — D500 Iron deficiency anemia secondary to blood loss (chronic): Secondary | ICD-10-CM

## 2016-04-06 DIAGNOSIS — D649 Anemia, unspecified: Secondary | ICD-10-CM | POA: Diagnosis not present

## 2016-04-06 DIAGNOSIS — D509 Iron deficiency anemia, unspecified: Secondary | ICD-10-CM | POA: Insufficient documentation

## 2016-04-06 LAB — POCT HEMOGLOBIN: HEMOGLOBIN: 9.2 g/dL — AB (ref 12.2–16.2)

## 2016-04-06 LAB — IRON AND TIBC
%SAT: 47 % (ref 11–50)
Iron: 158 ug/dL (ref 45–160)
TIBC: 334 ug/dL (ref 250–450)
UIBC: 176 ug/dL (ref 125–400)

## 2016-04-06 LAB — RETICULOCYTES
ABS Retic: 41600 cells/uL (ref 20000–80000)
RBC.: 4.16 MIL/uL (ref 3.80–5.10)
RETIC CT PCT: 1 %

## 2016-04-06 LAB — FERRITIN: Ferritin: 15 ng/mL (ref 10–232)

## 2016-04-06 NOTE — Patient Instructions (Addendum)
Iron-Rich Diet Iron is a mineral that helps your body to produce hemoglobin. Hemoglobin is a protein in your red blood cells that carries oxygen to your body's tissues. Eating too little iron may cause you to feel weak and tired, and it can increase your risk for infection. Eating enough iron is necessary for your body's metabolism, muscle function, and nervous system. Iron is naturally found in many foods. It can also be added to foods or fortified in foods. There are two types of dietary iron:  Heme iron. Heme iron is absorbed by the body more easily than nonheme iron. Heme iron is found in meat, poultry, and fish.  Nonheme iron. Nonheme iron is found in dietary supplements, iron-fortified grains, beans, and vegetables. You may need to follow an iron-rich diet if:  You have been diagnosed with iron deficiency or iron-deficiency anemia.  You have a condition that prevents you from absorbing dietary iron, such as:  Infection in your intestines.  Celiac disease. This involves long-lasting (chronic) inflammation of your intestines.  You do not eat enough iron.  You eat a diet that is high in foods that impair iron absorption.  You have lost a lot of blood.  You have heavy bleeding during your menstrual cycle.  You are pregnant. What is my plan? Your health care provider may help you to determine how much iron you need per day based on your condition. Generally, when a person consumes sufficient amounts of iron in the diet, the following iron needs are met:  Men.  24-56 years old: 11 mg per day.  59-71 years old: 8 mg per day.  Women.  46-49 years old: 15 mg per day.  61-31 years old: 18 mg per day.  Over 8 years old: 8 mg per day.  Pregnant women: 27 mg per day.  Breastfeeding women: 9 mg per day. What do I need to know about an iron-rich diet?  Eat fresh fruits and vegetables that are high in vitamin C along with foods that are high in iron. This will help increase the  amount of iron that your body absorbs from food, especially with foods containing nonheme iron. Foods that are high in vitamin C include oranges, peppers, tomatoes, and mango.  Take iron supplements only as directed by your health care provider. Overdose of iron can be life-threatening. If you were prescribed iron supplements, take them with orange juice or a vitamin C supplement.  Cook foods in pots and pans that are made from iron.  Eat nonheme iron-containing foods alongside foods that are high in heme iron. This helps to improve your iron absorption.  Certain foods and drinks contain compounds that impair iron absorption. Avoid eating these foods in the same meal as iron-rich foods or with iron supplements. These include:  Coffee, black tea, and red wine.  Milk, dairy products, and foods that are high in calcium.  Beans, soybeans, and peas.  Whole grains.  When eating foods that contain both nonheme iron and compounds that impair iron absorption, follow these tips to absorb iron better.  Soak beans overnight before cooking.  Soak whole grains overnight and drain them before using.  Ferment flours before baking, such as using yeast in bread dough. What foods can I eat? Grains  Iron-fortified breakfast cereal. Iron-fortified whole-wheat bread. Enriched rice. Sprouted grains. Vegetables  Spinach. Potatoes with skin. Green peas. Broccoli. Red and green bell peppers. Fermented vegetables. Fruits  Prunes. Raisins. Oranges. Strawberries. Mango. Grapefruit. Meats and Other Protein Sources  Sources  °Beef liver. Oysters. Beef. Shrimp. Turkey. Chicken. Tuna. Sardines. Chickpeas. Nuts. Tofu. °Beverages  °Tomato juice. Fresh orange juice. Prune juice. Hibiscus tea. Fortified instant breakfast shakes. °Condiments  °Tahini. Fermented soy sauce. °Sweets and Desserts  °Black-strap molasses. °Other  °Wheat germ. °The items listed above may not be a complete list of recommended foods or beverages.  Contact your dietitian for more options.  °What foods are not recommended? °Grains  °Whole grains. Bran cereal. Bran flour. Oats. °Vegetables  °Artichokes. Brussels sprouts. Kale. °Fruits  °Blueberries. Raspberries. Strawberries. Figs. °Meats and Other Protein Sources  °Soybeans. Products made from soy protein. °Dairy  °Milk. Cream. Cheese. Yogurt. Cottage cheese. °Beverages  °Coffee. Black tea. Red wine. °Sweets and Desserts  °Cocoa. Chocolate. Ice cream. °Other  °Basil. Oregano. Parsley. °The items listed above may not be a complete list of foods and beverages to avoid. Contact your dietitian for more information.  °This information is not intended to replace advice given to you by your health care provider. Make sure you discuss any questions you have with your health care provider. °Document Released: 09/06/2004 Document Revised: 08/13/2015 Document Reviewed: 08/20/2013 °Elsevier Interactive Patient Education © 2017 Elsevier Inc. ° ° ° °

## 2016-04-06 NOTE — Progress Notes (Signed)
Subjective:    CC:   HPI:  51 year old female comes in today to follow-up for anemia. Her last hemoglobin was 9.2 and we decided to start her on over-the-counter iron. She is taking 1 a day and has been tolerating that well. She said she's had a little bit of mild constipation with it but is not been very bothersome. She actually feels little better and has noticed some improvement in her energy levels. She has not had any shortness of breath. And in regards to cold intolerances she says she's always been that way. I did have an old hemoglobin on file that was around 12, some assuming that the potential baseline. She does report very heavy menstrual cycles. She is not currently on any type of birth control etc.  Past medical history, Surgical history, Family history not pertinant except as noted below, Social history, Allergies, and medications have been entered into the medical record, reviewed, and corrections made.   Review of Systems: No fevers, chills, night sweats, weight loss, chest pain, or shortness of breath.   Objective:    General: Well Developed, well nourished, and in no acute distress.  Neuro: Alert and oriented x3, extra-ocular muscles intact, sensation grossly intact.  HEENT: Normocephalic, atraumatic  Skin: Warm and dry, no rashes. Cardiac: Regular rate and rhythm, no murmurs rubs or gallops, no lower extremity edema.  Respiratory: Clear to auscultation bilaterally. Not using accessory muscles, speaking in full sentences.   Impression and Recommendations:    Anemia, most likely iron deficiency-we'll try to increase her iron to twice a day may need to watch for constipation. Also discussed getting an iron rich diet and give her handout with some additional information regarding that. We also discussed trying to control her periods with birth control. Encouraged her to at least consider it.    Discussed need for colon cancer screening.  Given infor for Cologuard info.

## 2016-04-11 ENCOUNTER — Other Ambulatory Visit: Payer: Self-pay | Admitting: *Deleted

## 2016-04-11 DIAGNOSIS — D500 Iron deficiency anemia secondary to blood loss (chronic): Secondary | ICD-10-CM

## 2016-04-11 NOTE — Addendum Note (Signed)
Addended by: Teddy Spike on: 04/11/2016 05:34 PM   Modules accepted: Orders

## 2016-04-26 ENCOUNTER — Encounter: Payer: Self-pay | Admitting: Family Medicine

## 2016-11-08 ENCOUNTER — Other Ambulatory Visit: Payer: Self-pay | Admitting: Family Medicine

## 2016-11-08 DIAGNOSIS — Z1231 Encounter for screening mammogram for malignant neoplasm of breast: Secondary | ICD-10-CM

## 2016-11-23 ENCOUNTER — Encounter (HOSPITAL_BASED_OUTPATIENT_CLINIC_OR_DEPARTMENT_OTHER): Payer: Self-pay

## 2016-11-23 ENCOUNTER — Ambulatory Visit (HOSPITAL_BASED_OUTPATIENT_CLINIC_OR_DEPARTMENT_OTHER)
Admission: RE | Admit: 2016-11-23 | Discharge: 2016-11-23 | Disposition: A | Discharge status: Home / Self Care | Payer: 59 | Source: Ambulatory Visit | Attending: Family Medicine | Admitting: Family Medicine

## 2016-11-23 DIAGNOSIS — Z1231 Encounter for screening mammogram for malignant neoplasm of breast: Secondary | ICD-10-CM | POA: Diagnosis present

## 2017-02-15 LAB — LIPID PANEL
Cholesterol: 154 (ref 0–200)
HDL: 59 (ref 35–70)
LDL CALC: 88
Triglycerides: 36 — AB (ref 40–160)

## 2017-05-16 ENCOUNTER — Ambulatory Visit (INDEPENDENT_AMBULATORY_CARE_PROVIDER_SITE_OTHER): Payer: 59 | Admitting: Family Medicine

## 2017-05-16 ENCOUNTER — Encounter: Payer: Self-pay | Admitting: Family Medicine

## 2017-05-16 VITALS — BP 107/58 | HR 74 | Ht 65.0 in | Wt 134.0 lb

## 2017-05-16 DIAGNOSIS — Z1211 Encounter for screening for malignant neoplasm of colon: Secondary | ICD-10-CM | POA: Diagnosis not present

## 2017-05-16 DIAGNOSIS — Z0000 Encounter for general adult medical examination without abnormal findings: Secondary | ICD-10-CM

## 2017-05-16 DIAGNOSIS — R7301 Impaired fasting glucose: Secondary | ICD-10-CM

## 2017-05-16 LAB — POCT GLYCOSYLATED HEMOGLOBIN (HGB A1C): HEMOGLOBIN A1C: 5.3

## 2017-05-16 NOTE — Progress Notes (Signed)
Subjective:     Jasmine Good is a 52 y.o. female and is here for a comprehensive physical exam. The patient reports no problems. She exercises every day.    Social History   Socioeconomic History  . Marital status: Single    Spouse name: Not on file  . Number of children: 0  . Years of education: Not on file  . Highest education level: Not on file  Occupational History  . 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 Financial resource strain: Not on file  . Food insecurity:    Worry: Not on file    Inability: Not on file  . Transportation needs:    Medical: Not on file    Non-medical: Not on file  Tobacco Use  . Smoking status: Never Smoker  . Smokeless tobacco: Never Used  Substance and Sexual Activity  . Alcohol use: No    Alcohol/week: 0.0 oz  . Drug use: No  . Sexual activity: Not Currently  Lifestyle  . Physical activity:    Days per week: Not on file    Minutes per session: Not on file  . Stress: Not on file  Relationships  . Social connections:    Talks on phone: Not on file    Gets together: Not on file    Attends religious service: Not on file    Active member of club or organization: Not on file    Attends meetings of clubs or organizations: Not on file    Relationship status: Not on file  . Intimate partner violence:    Fear of current or ex partner: Not on file    Emotionally abused: Not on file    Physically abused: Not on file    Forced sexual activity: Not on file  Other Topics Concern  . Not on file  Social History Narrative    regular exercise. No regular caffeine intake.     Health Maintenance  Topic Date Due  . COLONOSCOPY  03/05/2016  . TETANUS/TDAP  07/16/2017  . INFLUENZA VACCINE  09/06/2017  . MAMMOGRAM  11/24/2018  . PAP SMEAR  02/20/2019  . HIV Screening  Completed    The following portions of the patient's history were reviewed and updated as appropriate: allergies, current medications, past  family history, past medical history, past social history, past surgical history and problem list.  Review of Systems A comprehensive review of systems was negative.   Objective:    BP (!) 107/58   Pulse 74   Ht 5\' 5"  (1.651 m)   Wt 134 lb (60.8 kg)   SpO2 100%   BMI 22.30 kg/m  General appearance: alert, cooperative and appears stated age Head: Normocephalic, without obvious abnormality, atraumatic Eyes: conj clear, EOMI, PEERLA Ears: normal TM's and external ear canals both ears Nose: Nares normal. Septum midline. Mucosa normal. No drainage or sinus tenderness. Throat: lips, mucosa, and tongue normal; teeth and gums normal Neck: no adenopathy, no carotid bruit, no JVD, supple, symmetrical, trachea midline and thyroid not enlarged, symmetric, no tenderness/mass/nodules Back: symmetric, no curvature. ROM normal. No CVA tenderness. Lungs: clear to auscultation bilaterally Heart: regular rate and rhythm, S1, S2 normal, no murmur, click, rub or gallop Abdomen: soft, non-tender; bowel sounds normal; no masses,  no organomegaly Extremities: extremities normal, atraumatic, no cyanosis or edema Pulses: 2+ and symmetric Skin: Skin color, texture, turgor normal. No rashes or lesions Lymph nodes: Cervical adenopathy: nl and  Axillary adenopathy: nl Neurologic: Alert and oriented X 3, normal strength and tone. Normal symmetric reflexes. Normal coordination and gait    Assessment:    Healthy female exam.      Plan:     See After Visit Summary for Counseling Recommendations   Keep up a regular exercise program and make sure you are eating a healthy diet Try to eat 4 servings of dairy a day, or if you are lactose intolerant take a calcium with vitamin D daily.  Your vaccines are up to date.   H.O given for shingles vaccine.   Schedule colonoscopy. [place], [place] area.

## 2017-05-16 NOTE — Patient Instructions (Signed)

## 2017-05-17 LAB — COMPLETE METABOLIC PANEL WITH GFR
AG Ratio: 2 (calc) (ref 1.0–2.5)
ALT: 9 U/L (ref 6–29)
AST: 15 U/L (ref 10–35)
Albumin: 4.2 g/dL (ref 3.6–5.1)
Alkaline phosphatase (APISO): 45 U/L (ref 33–130)
BILIRUBIN TOTAL: 0.6 mg/dL (ref 0.2–1.2)
BUN/Creatinine Ratio: 32 (calc) — ABNORMAL HIGH (ref 6–22)
BUN: 14 mg/dL (ref 7–25)
CHLORIDE: 104 mmol/L (ref 98–110)
CO2: 28 mmol/L (ref 20–32)
Calcium: 8.8 mg/dL (ref 8.6–10.4)
Creat: 0.44 mg/dL — ABNORMAL LOW (ref 0.50–1.05)
GFR, EST AFRICAN AMERICAN: 135 mL/min/{1.73_m2} (ref >=60)
GFR, Est Non African American: 117 mL/min/{1.73_m2} (ref >=60)
Globulin: 2.1 g/dL (calc) (ref 1.9–3.7)
Glucose, Bld: 82 mg/dL (ref 65–99)
POTASSIUM: 4.2 mmol/L (ref 3.5–5.3)
Sodium: 138 mmol/L (ref 135–146)
TOTAL PROTEIN: 6.3 g/dL (ref 6.1–8.1)

## 2017-05-17 LAB — CBC
HEMATOCRIT: 33.9 % — AB (ref 35.0–45.0)
Hemoglobin: 11.6 g/dL — ABNORMAL LOW (ref 11.7–15.5)
MCH: 31 pg (ref 27.0–33.0)
MCHC: 34.2 g/dL (ref 32.0–36.0)
MCV: 90.6 fL (ref 80.0–100.0)
MPV: 10 fL (ref 7.5–12.5)
Platelets: 258 10*3/uL (ref 140–400)
RBC: 3.74 10*6/uL — AB (ref 3.80–5.10)
RDW: 11.8 % (ref 11.0–15.0)
WBC: 3.2 10*3/uL — ABNORMAL LOW (ref 3.8–10.8)

## 2017-05-17 LAB — IRON,TIBC AND FERRITIN PANEL
%SAT: 43 % (ref 11–50)
FERRITIN: 14 ng/mL (ref 10–232)
Iron: 140 ug/dL (ref 45–160)
TIBC: 324 mcg/dL (calc) (ref 250–450)

## 2017-05-18 ENCOUNTER — Other Ambulatory Visit: Payer: Self-pay | Admitting: *Deleted

## 2017-05-18 ENCOUNTER — Other Ambulatory Visit: Payer: Self-pay

## 2017-05-18 DIAGNOSIS — R790 Abnormal level of blood mineral: Secondary | ICD-10-CM

## 2017-05-18 DIAGNOSIS — Z1211 Encounter for screening for malignant neoplasm of colon: Secondary | ICD-10-CM

## 2017-06-08 NOTE — Discharge Instructions (Signed)
General Anesthesia, Adult, Care After °These instructions provide you with information about caring for yourself after your procedure. Your health care provider may also give you more specific instructions. Your treatment has been planned according to current medical practices, but problems sometimes occur. Call your health care provider if you have any problems or questions after your procedure. °What can I expect after the procedure? °After the procedure, it is common to have: °· Vomiting. °· A sore throat. °· Mental slowness. ° °It is common to feel: °· Nauseous. °· Cold or shivery. °· Sleepy. °· Tired. °· Sore or achy, even in parts of your body where you did not have surgery. ° °Follow these instructions at home: °For at least 24 hours after the procedure: °· Do not: °? Participate in activities where you could fall or become injured. °? Drive. °? Use heavy machinery. °? Drink alcohol. °? Take sleeping pills or medicines that cause drowsiness. °? Make important decisions or sign legal documents. °? Take care of children on your own. °· Rest. °Eating and drinking °· If you vomit, drink water, juice, or soup when you can drink without vomiting. °· Drink enough fluid to keep your urine clear or pale yellow. °· Make sure you have little or no nausea before eating solid foods. °· Follow the diet recommended by your health care provider. °General instructions °· Have a responsible adult stay with you until you are awake and alert. °· Return to your normal activities as told by your health care provider. Ask your health care provider what activities are safe for you. °· Take over-the-counter and prescription medicines only as told by your health care provider. °· If you smoke, do not smoke without supervision. °· Keep all follow-up visits as told by your health care provider. This is important. °Contact a health care provider if: °· You continue to have nausea or vomiting at home, and medicines are not helpful. °· You  cannot drink fluids or start eating again. °· You cannot urinate after 8-12 hours. °· You develop a skin rash. °· You have fever. °· You have increasing redness at the site of your procedure. °Get help right away if: °· You have difficulty breathing. °· You have chest pain. °· You have unexpected bleeding. °· You feel that you are having a life-threatening or urgent problem. °This information is not intended to replace advice given to you by your health care provider. Make sure you discuss any questions you have with your health care provider. °Document Released: 05/01/2000 Document Revised: 06/28/2015 Document Reviewed: 01/07/2015 °Elsevier Interactive Patient Education © 2018 Elsevier Inc. ° °

## 2017-06-11 ENCOUNTER — Ambulatory Visit
Admission: RE | Admit: 2017-06-11 | Discharge: 2017-06-11 | Disposition: A | Discharge status: Home / Self Care | Payer: 59 | Source: Ambulatory Visit | Attending: Gastroenterology | Admitting: Gastroenterology

## 2017-06-11 ENCOUNTER — Encounter
Admission: RE | Disposition: A | Discharge status: Home / Self Care | Payer: Self-pay | Source: Ambulatory Visit | Attending: Gastroenterology

## 2017-06-11 ENCOUNTER — Ambulatory Visit: Payer: 59 | Admitting: Anesthesiology

## 2017-06-11 DIAGNOSIS — D123 Benign neoplasm of transverse colon: Secondary | ICD-10-CM | POA: Diagnosis not present

## 2017-06-11 DIAGNOSIS — Z1211 Encounter for screening for malignant neoplasm of colon: Secondary | ICD-10-CM

## 2017-06-11 DIAGNOSIS — Z79899 Other long term (current) drug therapy: Secondary | ICD-10-CM | POA: Insufficient documentation

## 2017-06-11 DIAGNOSIS — D649 Anemia, unspecified: Secondary | ICD-10-CM | POA: Insufficient documentation

## 2017-06-11 HISTORY — PX: POLYPECTOMY: SHX5525

## 2017-06-11 HISTORY — DX: Anemia, unspecified: D64.9

## 2017-06-11 HISTORY — PX: COLONOSCOPY WITH PROPOFOL: SHX5780

## 2017-06-11 SURGERY — COLONOSCOPY WITH PROPOFOL
Anesthesia: General | Site: Rectum | Wound class: Contaminated

## 2017-06-11 MED ORDER — LIDOCAINE HCL (CARDIAC) PF 100 MG/5ML IV SOSY
PREFILLED_SYRINGE | INTRAVENOUS | Status: DC | PRN
  Administered 2017-06-11: 20 mg via INTRAVENOUS

## 2017-06-11 MED ORDER — SODIUM CHLORIDE 0.9 % IV SOLN: INTRAVENOUS | Status: DC

## 2017-06-11 MED ORDER — PROPOFOL 10 MG/ML IV BOLUS
INTRAVENOUS | Status: DC | PRN
  Administered 2017-06-11 (×2): 60 mg via INTRAVENOUS
  Administered 2017-06-11: 20 mg via INTRAVENOUS
  Administered 2017-06-11: 30 mg via INTRAVENOUS
  Administered 2017-06-11: 20 mg via INTRAVENOUS
  Administered 2017-06-11: 60 mg via INTRAVENOUS
  Administered 2017-06-11: 20 mg via INTRAVENOUS

## 2017-06-11 MED ORDER — LACTATED RINGERS IV SOLN
INTRAVENOUS | Status: DC
  Administered 2017-06-11: 10:00:00 via INTRAVENOUS

## 2017-06-11 MED ORDER — LACTATED RINGERS IV SOLN
10.0000 mL/h | INTRAVENOUS | Status: DC
  Administered 2017-06-11 (×2): via INTRAVENOUS

## 2017-06-11 MED ORDER — STERILE WATER FOR IRRIGATION IR SOLN
Status: DC | PRN
  Administered 2017-06-11: .5 mL

## 2017-06-11 SURGICAL SUPPLY — 10 items
CANISTER SUCT 1200ML W/VALVE (MISCELLANEOUS) ×4 IMPLANT
CLIP HMST 235XBRD CATH ROT (MISCELLANEOUS) IMPLANT
CLIP RESOLUTION 360 11X235 (MISCELLANEOUS)
FORCEPS BIOP RAD 4 LRG CAP 4 (CUTTING FORCEPS) ×4 IMPLANT
GOWN CVR UNV OPN BCK APRN NK (MISCELLANEOUS) ×4 IMPLANT
GOWN ISOL THUMB LOOP REG UNIV (MISCELLANEOUS) ×4
KIT ENDO PROCEDURE OLY (KITS) ×4 IMPLANT
SNARE SHORT THROW 13M SML OVAL (MISCELLANEOUS) IMPLANT
TRAP ETRAP POLY (MISCELLANEOUS) IMPLANT
WATER STERILE IRR 250ML POUR (IV SOLUTION) ×4 IMPLANT

## 2017-06-11 NOTE — Anesthesia Procedure Notes (Signed)
Procedure Name: MAC Performed by: Lind Guest, CRNA Pre-anesthesia Checklist: Patient identified, Emergency Drugs available, Suction available and Timeout performed Patient Re-evaluated:Patient Re-evaluated prior to induction Oxygen Delivery Method: Nasal cannula

## 2017-06-11 NOTE — Op Note (Signed)
[facility] Patient Name: Jasmine Good Procedure Date: 06/11/2017 10:58 AM MRN: [id number] Account #: [id number] Date of Birth: 1965-04-26 Admit Type: Outpatient Age: 52 Room: [facility] OR ROOM 01 Gender: Female Note Status: Finalized Procedure:            Colonoscopy Indications:          Screening for colorectal malignant neoplasm Providers:            [name] MD, MD Referring MD:         Beatrice Lecher (Referring MD) Medicines:            Propofol per Anesthesia Complications:        No immediate complications. Procedure:            Pre-Anesthesia Assessment:                       - Prior to the procedure, a History and Physical was                        performed, and patient medications and allergies were                        reviewed. The patient's tolerance of previous                        anesthesia was also reviewed. The risks and benefits of                        the procedure and the sedation options and risks were                        discussed with the patient. All questions were                        answered, and informed consent was obtained. Prior                        Anticoagulants: The patient has taken no previous                        anticoagulant or antiplatelet agents. ASA Grade                        Assessment: II - A patient with mild systemic disease.                        After reviewing the risks and benefits, the patient was                        deemed in satisfactory condition to undergo the                        procedure.                       After obtaining informed consent, the colonoscope was                        passed under direct vision. Throughout the procedure,  the patient's blood pressure, pulse, and oxygen                        saturations were monitored continuously. The Olympus                        Colonoscope 190 [id number]) was introduced through the                      anus and advanced to the the cecum, identified by                        appendiceal orifice and ileocecal valve. The                        colonoscopy was performed without difficulty. The                        patient tolerated the procedure well. The quality of                        the bowel preparation was excellent. Findings:      The perianal and digital rectal examinations were normal.      A 3 mm polyp was found in the transverse colon. The polyp was sessile.       The polyp was removed with a cold biopsy forceps. Resection and       retrieval were complete. Impression:           - One 3 mm polyp in the transverse colon, removed with                        a cold biopsy forceps. Resected and retrieved. Recommendation:       - Discharge patient to home.                       - Resume previous diet.                       - Continue present medications.                       - Await pathology results.                       - Repeat colonoscopy in 5 years if polyp adenoma and 10                        years if hyperplastic Procedure Code(s):    --- Professional ---                       [phone], Colonoscopy, flexible; with biopsy, single or                        multiple Diagnosis Code(s):    --- Professional ---                       Z12.11, Encounter for screening for malignant neoplasm                        of colon  D12.3, Benign neoplasm of transverse colon (hepatic                        flexure or splenic flexure) CPT copyright 2017 American Medical Association. All rights reserved. The codes documented in this report are preliminary and upon coder review may  be revised to meet current compliance requirements. [name] MD, MD 06/11/2017 11:23:14 AM This report has been signed electronically. Number of Addenda: 0 Note Initiated On: 06/11/2017 10:58 AM Scope Withdrawal Time: 0 hours 6 minutes 24 seconds  Total Procedure Duration:  0 hours 14 minutes 56 seconds       [facility]

## 2017-06-11 NOTE — H&P (Signed)
Jasmine Lame, MD [address] [place], [place] [address] Phone: [phone] Fax : [phone]  Primary Care Physician:  Hali Marry, MD Primary Gastroenterologist:  Dr. Allen Norris  Pre-Procedure History & Physical: HPI:  MARRIANNE Good is a 52 y.o. female is here for a screening colonoscopy.   Past Medical History:  Diagnosis Date  . Hemoglobin low   . Uterine fibroid     Past Surgical History:  Procedure Laterality Date  . UTERINE FIBROID SURGERY  08/2001    Prior to Admission medications   Medication Sig Start Date End Date Taking? Authorizing Provider  Multiple Vitamins-Calcium (ONE-A-DAY WOMENS PO) Take by mouth daily.   Yes [provider]  Ferrous Sulfate (IRON) 325 (65 Fe) MG TABS Take by mouth daily.    [provider]    Allergies as of 05/18/2017  . (No Known Allergies)    Family History  Problem Relation Age of Onset  . Diabetes Father 17    Social History   Socioeconomic History  . Marital status: Single    Spouse name: Not on file  . Number of children: 0  . Years of education: Not on file  . Highest education level: Not on file  Occupational History  . 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 Financial resource strain: Not on file  . Food insecurity:    Worry: Not on file    Inability: Not on file  . Transportation needs:    Medical: Not on file    Non-medical: Not on file  Tobacco Use  . Smoking status: Never Smoker  . Smokeless tobacco: Never Used  Substance and Sexual Activity  . Alcohol use: No    Alcohol/week: 0.0 oz  . Drug use: No  . Sexual activity: Not Currently  Lifestyle  . Physical activity:    Days per week: Not on file    Minutes per session: Not on file  . Stress: Not on file  Relationships  . Social connections:    Talks on phone: Not on file    Gets together: Not on file    Attends religious service: Not on file    Active member of club  or organization: Not on file    Attends meetings of clubs or organizations: Not on file    Relationship status: Not on file  . Intimate partner violence:    Fear of current or ex partner: Not on file    Emotionally abused: Not on file    Physically abused: Not on file    Forced sexual activity: Not on file  Other Topics Concern  . Not on file  Social History Narrative    regular exercise. No regular caffeine intake.      Review of Systems: See HPI, otherwise negative ROS  Physical Exam: BP 116/70   Pulse 76   Temp 98.1 F (36.7 C) (Temporal)   Resp 16   Ht 5\' 5"  (1.651 m)   Wt 131 lb (59.4 kg)   LMP 05/21/2017 (Approximate) Comment: preg tes neg  SpO2 100%   BMI 21.80 kg/m  General:   Alert,  pleasant and cooperative in NAD Head:  Normocephalic and atraumatic. Neck:  Supple; no masses or thyromegaly. Lungs:  Clear throughout to auscultation.    Heart:  Regular rate and rhythm. Abdomen:  Soft, nontender and nondistended. Normal bowel sounds, without guarding, and without rebound.   Neurologic:  Alert  and  oriented x4;  grossly normal neurologically.  Impression/Plan: DEBRA COLON is now here to undergo a screening colonoscopy.  Risks, benefits, and alternatives regarding colonoscopy have been reviewed with the patient.  Questions have been answered.  All parties agreeable.

## 2017-06-11 NOTE — Anesthesia Postprocedure Evaluation (Signed)
Anesthesia Post Note  Patient: KASIAH MANKA  Procedure(s) Performed: COLONOSCOPY WITH PROPOFOL (N/A Rectum) POLYPECTOMY (Rectum)  Patient location during evaluation: PACU Anesthesia Type: General Level of consciousness: awake and alert and oriented Pain management: satisfactory to patient Vital Signs Assessment: post-procedure vital signs reviewed and stable Respiratory status: spontaneous breathing, nonlabored ventilation and respiratory function stable Cardiovascular status: blood pressure returned to baseline and stable Postop Assessment: Adequate PO intake and No signs of nausea or vomiting Anesthetic complications: no    Raliegh Ip

## 2017-06-11 NOTE — Anesthesia Preprocedure Evaluation (Signed)
Anesthesia Evaluation  Patient identified by MRN, date of birth, ID band Patient awake    Reviewed: Allergy & Precautions, H&P , NPO status , Patient's Chart, lab work & pertinent test results  Airway Mallampati: II  TM Distance: >3 FB Neck ROM: full    Dental no notable dental hx.    Pulmonary    Pulmonary exam normal breath sounds clear to auscultation       Cardiovascular Normal cardiovascular exam Rhythm:regular Rate:Normal     Neuro/Psych    GI/Hepatic   Endo/Other    Renal/GU      Musculoskeletal   Abdominal   Peds  Hematology   Anesthesia Other Findings   Reproductive/Obstetrics                             Anesthesia Physical Anesthesia Plan  ASA: I  Anesthesia Plan: General   Post-op Pain Management:    Induction: Intravenous  PONV Risk Score and Plan: 3 and Propofol infusion and Treatment may vary due to age or medical condition  Airway Management Planned: Natural Airway  Additional Equipment:   Intra-op Plan:   Post-operative Plan:   Informed Consent: I have reviewed the patients History and Physical, chart, labs and discussed the procedure including the risks, benefits and alternatives for the proposed anesthesia with the patient or authorized representative who has indicated his/her understanding and acceptance.     Plan Discussed with: CRNA  Anesthesia Plan Comments:         Anesthesia Quick Evaluation

## 2017-06-11 NOTE — Transfer of Care (Signed)
Immediate Anesthesia Transfer of Care Note  Patient: Jasmine Good  Procedure(s) Performed: COLONOSCOPY WITH PROPOFOL (N/A Rectum) POLYPECTOMY (Rectum)  Patient Location: PACU  Anesthesia Type: General  Level of Consciousness: awake, alert  and patient cooperative  Airway and Oxygen Therapy: Patient Spontanous Breathing and Patient connected to supplemental oxygen  Post-op Assessment: Post-op Vital signs reviewed, Patient's Cardiovascular Status Stable, Respiratory Function Stable, Patent Airway and No signs of Nausea or vomiting  Post-op Vital Signs: Reviewed and stable  Complications: No apparent anesthesia complications

## 2017-06-12 ENCOUNTER — Encounter: Payer: Self-pay | Admitting: Gastroenterology

## 2017-06-27 ENCOUNTER — Encounter: Payer: Self-pay | Admitting: Family Medicine

## 2017-11-05 LAB — LIPID PANEL
Cholesterol: 158 (ref 0–200)
HDL: 56 (ref 35–70)
LDL CALC: 94
TRIGLYCERIDES: 41 (ref 40–160)

## 2017-11-05 LAB — BASIC METABOLIC PANEL
BUN: 26 — AB (ref 4–21)
Creatinine: 0.5 (ref 0.5–1.1)
Glucose: 88
POTASSIUM: 4.2 (ref 3.4–5.3)
Sodium: 139 (ref 137–147)

## 2017-11-05 LAB — HEPATIC FUNCTION PANEL
ALT: 8 (ref 7–35)
AST: 14 (ref 13–35)
Alkaline Phosphatase: 42 (ref 25–125)

## 2017-11-06 ENCOUNTER — Other Ambulatory Visit: Payer: Self-pay | Admitting: Family Medicine

## 2017-11-06 DIAGNOSIS — Z1231 Encounter for screening mammogram for malignant neoplasm of breast: Secondary | ICD-10-CM

## 2017-11-16 ENCOUNTER — Telehealth: Payer: Self-pay | Admitting: *Deleted

## 2017-11-16 DIAGNOSIS — R790 Abnormal level of blood mineral: Secondary | ICD-10-CM

## 2017-11-16 NOTE — Telephone Encounter (Signed)
Called pt and lvm advising her that she does not need to be seen just to have her lab for hemoglobin checked. This lab can be faxed and she can just come in and see the nurse for a flu shot unless she has something else she would like to discuss with Dr. Madilyn Fireman she can just come in for a nurse visit for her flu shot and then go downstairs for her blood work. Asked that she call back .Jasmine Good, [place]

## 2017-11-19 ENCOUNTER — Encounter: Payer: Self-pay | Admitting: Family Medicine

## 2017-11-19 ENCOUNTER — Ambulatory Visit (INDEPENDENT_AMBULATORY_CARE_PROVIDER_SITE_OTHER): Payer: 59 | Admitting: Family Medicine

## 2017-11-19 VITALS — BP 100/49 | HR 64 | Ht 65.0 in | Wt 137.0 lb

## 2017-11-19 DIAGNOSIS — R7989 Other specified abnormal findings of blood chemistry: Secondary | ICD-10-CM | POA: Diagnosis not present

## 2017-11-19 DIAGNOSIS — D509 Iron deficiency anemia, unspecified: Secondary | ICD-10-CM

## 2017-11-19 DIAGNOSIS — Z23 Encounter for immunization: Secondary | ICD-10-CM

## 2017-11-19 DIAGNOSIS — R790 Abnormal level of blood mineral: Secondary | ICD-10-CM | POA: Diagnosis not present

## 2017-11-19 NOTE — Telephone Encounter (Signed)
Pt was seen today.Jasmine Good, [place]

## 2017-11-19 NOTE — Progress Notes (Signed)
   Subjective:    Patient ID: Altamese Dilling, female    DOB: 1965/02/20, 52 y.o.   MRN: [id number]  HPI 52 year old female is here today for follow-up of low ferritin.  We last checked her iron levels and ferritin in April proximally 6 months ago.  Her ferritin was 14 at that time her hemoglobin did look better at 11.6 which is a big jump from the previous year when it was 9.2.  She is due to recheck those levels.  She also had some labs done through lab core through her workplace via a wellness program.  She did bring in those labs and had a couple of questions including a notation for low creatinine and low calcium level.  Wanted to get her flu vaccine today.    Review of Systems     Objective:   Physical Exam  Constitutional: She is oriented to person, place, and time. She appears well-developed and well-nourished.  HENT:  Head: Normocephalic and atraumatic.  Eyes: Conjunctivae and EOM are normal.  Cardiovascular: Normal rate.  Pulmonary/Chest: Effort normal.  Neurological: She is alert and oriented to person, place, and time.  Skin: Skin is dry. No pallor.  Psychiatric: She has a normal mood and affect. Her behavior is normal.  Vitals reviewed.         Assessment & Plan:  Low ferritin-she has been taking her iron supplement through the summer.  We will recheck levels today and see if she might hopefully be able to discontinue it.  Hypocalcemia-noted on the labs that she brought in from work.  We discussed how to get calcium from her diet but also may want to consider a calcium supplement.  Flu vaccine given today. Tdap given today.

## 2017-11-20 LAB — FERRITIN: FERRITIN: 19 ng/mL (ref 16–232)

## 2017-11-20 LAB — HEMOGLOBIN: Hemoglobin: 11.7 g/dL (ref 11.7–15.5)

## 2017-11-21 ENCOUNTER — Other Ambulatory Visit: Payer: Self-pay | Admitting: *Deleted

## 2017-11-21 DIAGNOSIS — D509 Iron deficiency anemia, unspecified: Secondary | ICD-10-CM

## 2017-11-26 ENCOUNTER — Ambulatory Visit (HOSPITAL_BASED_OUTPATIENT_CLINIC_OR_DEPARTMENT_OTHER)
Admission: RE | Admit: 2017-11-26 | Discharge: 2017-11-26 | Disposition: A | Discharge status: Home / Self Care | Payer: 59 | Source: Ambulatory Visit | Attending: Family Medicine | Admitting: Family Medicine

## 2017-11-26 DIAGNOSIS — Z1231 Encounter for screening mammogram for malignant neoplasm of breast: Secondary | ICD-10-CM | POA: Insufficient documentation

## 2017-11-28 NOTE — Progress Notes (Signed)
Call pt: normal mammogram. Follow up in 1 year.

## 2017-11-30 ENCOUNTER — Encounter: Payer: Self-pay | Admitting: Family Medicine

## 2017-12-27 ENCOUNTER — Ambulatory Visit (INDEPENDENT_AMBULATORY_CARE_PROVIDER_SITE_OTHER): Payer: 59 | Admitting: Family Medicine

## 2017-12-27 VITALS — Temp 98.4°F

## 2017-12-27 DIAGNOSIS — Z23 Encounter for immunization: Secondary | ICD-10-CM | POA: Diagnosis not present

## 2017-12-27 NOTE — Progress Notes (Signed)
Patient presents to clinic today for her first Shingles vaccine. Patient's temperature was 98.4 (oral). Vaccine given in left deltoid today without complications. Patient was advised to schedule a nurse visit for next Shingles vaccine two months from today. No further questions or concerns at this time. Casilda Carls, CMA.

## 2017-12-27 NOTE — Progress Notes (Signed)
Agree with documentation as above.   Ardine Iacovelli, MD  

## 2018-02-26 ENCOUNTER — Ambulatory Visit: Payer: 59

## 2018-02-27 ENCOUNTER — Ambulatory Visit (INDEPENDENT_AMBULATORY_CARE_PROVIDER_SITE_OTHER): Payer: 59 | Admitting: Family Medicine

## 2018-02-27 VITALS — BP 103/55 | HR 70 | Temp 97.4°F | Wt 139.0 lb

## 2018-02-27 DIAGNOSIS — Z23 Encounter for immunization: Secondary | ICD-10-CM | POA: Diagnosis not present

## 2018-02-27 NOTE — Progress Notes (Signed)
Pt in today for shingrix vaccine. This is 2  of 2 of the shingrix series. Vitals taken and no fever noted. Vaccine was given in left deltoid. Pt tolerated well with no immediate complications.

## 2018-02-27 NOTE — Progress Notes (Signed)
Agree with documentation as above.   Ariez Neilan, MD  

## 2018-05-20 ENCOUNTER — Encounter: Payer: 59 | Admitting: Family Medicine

## 2018-06-19 ENCOUNTER — Encounter: Payer: 59 | Admitting: Family Medicine

## 2018-08-05 ENCOUNTER — Encounter: Payer: Self-pay | Admitting: Family Medicine

## 2018-08-05 ENCOUNTER — Ambulatory Visit (INDEPENDENT_AMBULATORY_CARE_PROVIDER_SITE_OTHER): Payer: 59 | Admitting: Family Medicine

## 2018-08-05 VITALS — BP 100/53 | HR 63 | Ht 65.0 in | Wt 139.0 lb

## 2018-08-05 DIAGNOSIS — Z0000 Encounter for general adult medical examination without abnormal findings: Secondary | ICD-10-CM | POA: Diagnosis not present

## 2018-08-05 DIAGNOSIS — D509 Iron deficiency anemia, unspecified: Secondary | ICD-10-CM

## 2018-08-05 NOTE — Progress Notes (Signed)
Subjective:     Jasmine Good is a 53 y.o. female and is here for a comprehensive physical exam. The patient reports no problems.     Social History   Socioeconomic History  . Marital status: Single    Spouse name: Not on file  . Number of children: 0  . Years of education: Not on file  . Highest education level: Not on file  Occupational History  . 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 Financial resource strain: Not on file  . Food insecurity    Worry: Not on file    Inability: Not on file  . Transportation needs    Medical: Not on file    Non-medical: Not on file  Tobacco Use  . Smoking status: Never Smoker  . Smokeless tobacco: Never Used  Substance and Sexual Activity  . Alcohol use: No    Alcohol/week: 0.0 standard drinks  . Drug use: No  . Sexual activity: Not Currently  Lifestyle  . Physical activity    Days per week: Not on file    Minutes per session: Not on file  . Stress: Not on file  Relationships  . Social Herbalist on phone: Not on file    Gets together: Not on file    Attends religious service: Not on file    Active member of club or organization: Not on file    Attends meetings of clubs or organizations: Not on file    Relationship status: Not on file  . Intimate partner violence    Fear of current or ex partner: Not on file    Emotionally abused: Not on file    Physically abused: Not on file    Forced sexual activity: Not on file  Other Topics Concern  . Not on file  Social History Narrative    regular exercise. No regular caffeine intake.     Health Maintenance  Topic Date Due  . INFLUENZA VACCINE  09/07/2018  . PAP SMEAR-Modifier  02/20/2019  . MAMMOGRAM  11/27/2019  . COLONOSCOPY  06/12/2027  . TETANUS/TDAP  11/20/2027  . HIV Screening  Completed    The following portions of the patient's history were reviewed and updated as appropriate: allergies, current medications, past family  history, past medical history, past social history, past surgical history and problem list.  Review of Systems A comprehensive review of systems was negative.   Objective:    BP (!) 100/53   Pulse 63   Ht 5\' 5"  (1.651 m)   Wt 139 lb (63 kg)   LMP 07/08/2018 (Approximate)   SpO2 100%   BMI 23.13 kg/m  General appearance: alert, cooperative and appears stated age Head: Normocephalic, without obvious abnormality, atraumatic Eyes: conj clear, EOMI, PEERLA Ears: normal TM's and external ear canals both ears Nose: Nares normal. Septum midline. Mucosa normal. No drainage or sinus tenderness. Throat: lips, mucosa, and tongue normal; teeth and gums normal Neck: no adenopathy, no carotid bruit, no JVD, supple, symmetrical, trachea midline and thyroid not enlarged, symmetric, no tenderness/mass/nodules Back: symmetric, no curvature. ROM normal. No CVA tenderness. Lungs: clear to auscultation bilaterally Breasts: normal appearance, no masses or tenderness Heart: regular rate and rhythm, S1, S2 normal, no murmur, click, rub or gallop Abdomen: soft, non-tender; bowel sounds normal; no masses,  no organomegaly Extremities: extremities normal, atraumatic, no cyanosis or edema Pulses: 2+ and symmetric Skin: Skin color, texture, turgor normal.  No rashes or lesions Lymph nodes: Cervical, supraclavicular, and axillary nodes normal. Neurologic: Alert and oriented X 3, normal strength and tone. Normal symmetric reflexes. Normal coordination and gait    Assessment:    Healthy female exam.     Plan:     See After Visit Summary for Counseling Recommendations    Keep up a regular exercise program and make sure you are eating a healthy diet Try to eat 4 servings of dairy a day, or if you are lactose intolerant take a calcium with vitamin D daily.  Your vaccines are up to date.

## 2018-08-05 NOTE — Patient Instructions (Signed)
Health Maintenance, Female Adopting a healthy lifestyle and getting preventive care are important in promoting health and wellness. Ask your health care provider about:  The right schedule for you to have regular tests and exams.  Things you can do on your own to prevent diseases and keep yourself healthy. What should I know about diet, weight, and exercise? Eat a healthy diet   Eat a diet that includes plenty of vegetables, fruits, low-fat dairy products, and lean protein.  Do not eat a lot of foods that are high in solid fats, added sugars, or sodium. Maintain a healthy weight Body mass index (BMI) is used to identify weight problems. It estimates body fat based on height and weight. Your health care provider can help determine your BMI and help you achieve or maintain a healthy weight. Get regular exercise Get regular exercise. This is one of the most important things you can do for your health. Most adults should:  Exercise for at least 150 minutes each week. The exercise should increase your heart rate and make you sweat (moderate-intensity exercise).  Do strengthening exercises at least twice a week. This is in addition to the moderate-intensity exercise.  Spend less time sitting. Even light physical activity can be beneficial. Watch cholesterol and blood lipids Have your blood tested for lipids and cholesterol at 52 years of age, then have this test every 5 years. Have your cholesterol levels checked more often if:  Your lipid or cholesterol levels are high.  You are older than 52 years of age.  You are at high risk for heart disease. What should I know about cancer screening? Depending on your health history and family history, you may need to have cancer screening at various ages. This may include screening for:  Breast cancer.  Cervical cancer.  Colorectal cancer.  Skin cancer.  Lung cancer. What should I know about heart disease, diabetes, and high blood  pressure? Blood pressure and heart disease  High blood pressure causes heart disease and increases the risk of stroke. This is more likely to develop in people who have high blood pressure readings, are of African descent, or are overweight.  Have your blood pressure checked: ? Every 3-5 years if you are 18-39 years of age. ? Every year if you are 40 years old or older. Diabetes Have regular diabetes screenings. This checks your fasting blood sugar level. Have the screening done:  Once every three years after age 40 if you are at a normal weight and have a low risk for diabetes.  More often and at a younger age if you are overweight or have a high risk for diabetes. What should I know about preventing infection? Hepatitis B If you have a higher risk for hepatitis B, you should be screened for this virus. Talk with your health care provider to find out if you are at risk for hepatitis B infection. Hepatitis C Testing is recommended for:  Everyone born from 1945 through 1965.  Anyone with known risk factors for hepatitis C. Sexually transmitted infections (STIs)  Get screened for STIs, including gonorrhea and chlamydia, if: ? You are sexually active and are younger than 52 years of age. ? You are older than 52 years of age and your health care provider tells you that you are at risk for this type of infection. ? Your sexual activity has changed since you were last screened, and you are at increased risk for chlamydia or gonorrhea. Ask your health care provider if   you are at risk.  Ask your health care provider about whether you are at high risk for HIV. Your health care provider may recommend a prescription medicine to help prevent HIV infection. If you choose to take medicine to prevent HIV, you should first get tested for HIV. You should then be tested every 3 months for as long as you are taking the medicine. Pregnancy  If you are about to stop having your period (premenopausal) and  you may become pregnant, seek counseling before you get pregnant.  Take 400 to 800 micrograms (mcg) of folic acid every day if you become pregnant.  Ask for birth control (contraception) if you want to prevent pregnancy. Osteoporosis and menopause Osteoporosis is a disease in which the bones lose minerals and strength with aging. This can result in bone fractures. If you are 65 years old or older, or if you are at risk for osteoporosis and fractures, ask your health care provider if you should:  Be screened for bone loss.  Take a calcium or vitamin D supplement to lower your risk of fractures.  Be given hormone replacement therapy (HRT) to treat symptoms of menopause. Follow these instructions at home: Lifestyle  Do not use any products that contain nicotine or tobacco, such as cigarettes, e-cigarettes, and chewing tobacco. If you need help quitting, ask your health care provider.  Do not use street drugs.  Do not share needles.  Ask your health care provider for help if you need support or information about quitting drugs. Alcohol use  Do not drink alcohol if: ? Your health care provider tells you not to drink. ? You are pregnant, may be pregnant, or are planning to become pregnant.  If you drink alcohol: ? Limit how much you use to 0-1 drink a day. ? Limit intake if you are breastfeeding.  Be aware of how much alcohol is in your drink. In the [place] one drink equals one 12 oz bottle of beer (355 mL), one 5 oz glass of wine (148 mL), or one 1 oz glass of hard liquor (44 mL). General instructions  Schedule regular health, dental, and eye exams.  Stay current with your vaccines.  Tell your health care provider if: ? You often feel depressed. ? You have ever been abused or do not feel safe at home. Summary  Adopting a healthy lifestyle and getting preventive care are important in promoting health and wellness.  Follow your health care provider's instructions about healthy  diet, exercising, and getting tested or screened for diseases.  Follow your health care provider's instructions on monitoring your cholesterol and blood pressure. This information is not intended to replace advice given to you by your health care provider. Make sure you discuss any questions you have with your health care provider. Document Released: 08/08/2010 Document Revised: 01/16/2018 Document Reviewed: 01/16/2018 Elsevier Patient Education  2020 Elsevier Inc.  

## 2018-08-06 ENCOUNTER — Other Ambulatory Visit: Payer: Self-pay | Admitting: Neurology

## 2018-08-06 DIAGNOSIS — D509 Iron deficiency anemia, unspecified: Secondary | ICD-10-CM

## 2018-08-06 LAB — CBC
HCT: 34.9 % — ABNORMAL LOW (ref 35.0–45.0)
Hemoglobin: 11.8 g/dL (ref 11.7–15.5)
MCH: 31.4 pg (ref 27.0–33.0)
MCHC: 33.8 g/dL (ref 32.0–36.0)
MCV: 92.8 fL (ref 80.0–100.0)
MPV: 10.4 fL (ref 7.5–12.5)
Platelets: 251 10*3/uL (ref 140–400)
RBC: 3.76 10*6/uL — ABNORMAL LOW (ref 3.80–5.10)
RDW: 11.9 % (ref 11.0–15.0)
WBC: 3.8 10*3/uL (ref 3.8–10.8)

## 2018-08-06 LAB — HEMOGLOBIN A1C
Hgb A1c MFr Bld: 5.4 % of total Hgb (ref <5.7)
Mean Plasma Glucose: 108 (calc)
eAG (mmol/L): 6 (calc)

## 2018-08-06 LAB — IRON,TIBC AND FERRITIN PANEL
%SAT: 23 % (calc) (ref 16–45)
Ferritin: 14 ng/mL — ABNORMAL LOW (ref 16–232)
Iron: 69 ug/dL (ref 45–160)
TIBC: 296 mcg/dL (calc) (ref 250–450)

## 2018-08-06 LAB — COMPLETE METABOLIC PANEL WITH GFR
AG Ratio: 1.9 (calc) (ref 1.0–2.5)
ALT: 8 U/L (ref 6–29)
AST: 13 U/L (ref 10–35)
Albumin: 4.2 g/dL (ref 3.6–5.1)
Alkaline phosphatase (APISO): 41 U/L (ref 37–153)
BUN/Creatinine Ratio: 30 (calc) — ABNORMAL HIGH (ref 6–22)
BUN: 14 mg/dL (ref 7–25)
CO2: 26 mmol/L (ref 20–32)
Calcium: 8.9 mg/dL (ref 8.6–10.4)
Chloride: 105 mmol/L (ref 98–110)
Creat: 0.47 mg/dL — ABNORMAL LOW (ref 0.50–1.05)
GFR, Est African American: 132 mL/min/{1.73_m2} (ref >=60)
GFR, Est Non African American: 114 mL/min/{1.73_m2} (ref >=60)
Globulin: 2.2 g/dL (calc) (ref 1.9–3.7)
Glucose, Bld: 84 mg/dL (ref 65–99)
Potassium: 4.2 mmol/L (ref 3.5–5.3)
Sodium: 141 mmol/L (ref 135–146)
Total Bilirubin: 0.4 mg/dL (ref 0.2–1.2)
Total Protein: 6.4 g/dL (ref 6.1–8.1)

## 2018-09-26 ENCOUNTER — Ambulatory Visit (INDEPENDENT_AMBULATORY_CARE_PROVIDER_SITE_OTHER): Payer: 59

## 2018-09-26 ENCOUNTER — Ambulatory Visit (INDEPENDENT_AMBULATORY_CARE_PROVIDER_SITE_OTHER): Payer: 59 | Admitting: Sports Medicine

## 2018-09-26 ENCOUNTER — Other Ambulatory Visit: Payer: Self-pay

## 2018-09-26 ENCOUNTER — Encounter: Payer: Self-pay | Admitting: Rehabilitative and Restorative Service Providers"

## 2018-09-26 ENCOUNTER — Encounter: Payer: Self-pay | Admitting: Sports Medicine

## 2018-09-26 ENCOUNTER — Ambulatory Visit (INDEPENDENT_AMBULATORY_CARE_PROVIDER_SITE_OTHER): Payer: 59 | Admitting: Rehabilitative and Restorative Service Providers"

## 2018-09-26 DIAGNOSIS — R29898 Other symptoms and signs involving the musculoskeletal system: Secondary | ICD-10-CM

## 2018-09-26 DIAGNOSIS — M545 Low back pain, unspecified: Secondary | ICD-10-CM

## 2018-09-26 MED ORDER — KETOROLAC TROMETHAMINE 30 MG/ML IJ SOLN
30.0000 mg | Freq: Once | INTRAMUSCULAR | Status: AC
  Administered 2018-09-26: 30 mg via INTRAMUSCULAR

## 2018-09-26 MED ORDER — PREDNISONE 50 MG PO TABS: ORAL_TABLET | ORAL | 0 refills | Status: DC

## 2018-09-26 MED ORDER — METHYLPREDNISOLONE SODIUM SUCC 125 MG IJ SOLR
125.0000 mg | Freq: Once | INTRAMUSCULAR | Status: AC
  Administered 2018-09-26: 125 mg via INTRAMUSCULAR

## 2018-09-26 NOTE — Patient Instructions (Signed)
Access Code: [email or web address]  URL: [email or web address]  Date: 09/26/2018  Prepared by: Gillermo Murdoch   Exercises  Prone Press Up - 5-10 reps - 1 sets - 2 sec hold - 2-3x daily - 7x weekly  Cat Cow - 5 reps - 1 sets - 2-3 sec hold - 2-3x daily - 7x weekly  Patient Education  Posture and Body Mechanics  TENS Unit

## 2018-09-26 NOTE — Progress Notes (Signed)
Subjective:    CC: Acute low back pain  HPI: This is a very pleasant 53 year old female, she has been doing a lot of moving at work, more recently she bent over to pick something up and felt severe pain in the midline of her low back with radiation to the left and right paralumbar muscles, worse with flexion, no bowel or bladder dysfunction, saddle numbness, no constitutional symptoms, no trauma.  I reviewed the past medical history, family history, social history, surgical history, and allergies today and no changes were needed.  Please see the problem list section below in epic for further details.  Past Medical History: Past Medical History:  Diagnosis Date  . Hemoglobin low   . Uterine fibroid    Past Surgical History: Past Surgical History:  Procedure Laterality Date  . COLONOSCOPY WITH PROPOFOL N/A 06/11/2017   Procedure: COLONOSCOPY WITH PROPOFOL;  Surgeon: [name], MD;  Location: [place];  Service: Endoscopy;  Laterality: N/A;  . POLYPECTOMY  06/11/2017   Procedure: POLYPECTOMY;  Surgeon: [name], MD;  Location: [place];  Service: Endoscopy;;  . UTERINE FIBROID SURGERY  08/2001   Social History: Social History   Socioeconomic History  . Marital status: Single    Spouse name: Not on file  . Number of children: 0  . Years of education: Not on file  . Highest education level: Not on file  Occupational History  . 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 Financial resource strain: Not on file  . Food insecurity    Worry: Not on file    Inability: Not on file  . Transportation needs    Medical: Not on file    Non-medical: Not on file  Tobacco Use  . Smoking status: Never Smoker  . Smokeless tobacco: Never Used  Substance and Sexual Activity  . Alcohol use: No    Alcohol/week: 0.0 standard drinks  . Drug use: No  . Sexual activity: Not Currently  Lifestyle  . Physical activity    Days per week:  Not on file    Minutes per session: Not on file  . Stress: Not on file  Relationships  . Social Herbalist on phone: Not on file    Gets together: Not on file    Attends religious service: Not on file    Active member of club or organization: Not on file    Attends meetings of clubs or organizations: Not on file    Relationship status: Not on file  Other Topics Concern  . Not on file  Social History Narrative    regular exercise. No regular caffeine intake.     Family History: Family History  Problem Relation Age of Onset  . Diabetes Father 26   Allergies: No Known Allergies Medications: See med rec.  Review of Systems: No fevers, chills, night sweats, weight loss, chest pain, or shortness of breath.   Objective:    General: Well Developed, well nourished, and in no acute distress.  Neuro: Alert and oriented x3, extra-ocular muscles intact, sensation grossly intact.  HEENT: Normocephalic, atraumatic, pupils equal round reactive to light, neck supple, no masses, no lymphadenopathy, thyroid nonpalpable.  Skin: Warm and dry, no rashes. Cardiac: Regular rate and rhythm, no murmurs rubs or gallops, no lower extremity edema.  Respiratory: Clear to auscultation bilaterally. Not using accessory muscles, speaking in full sentences. Back Exam:  Inspection: Unremarkable  Motion: Flexion  45 deg, Extension 45 deg, Side Bending to 45 deg bilaterally,  Rotation to 45 deg bilaterally  SLR laying: Negative  XSLR laying: Negative  Palpable tenderness: None. FABER: negative. Sensory change: Gross sensation intact to all lumbar and sacral dermatomes.  Reflexes: 2+ at both patellar tendons, 2+ at achilles tendons, Babinski's downgoing.  Strength at foot  Plantar-flexion: 5/5 Dorsi-flexion: 5/5 Eversion: 5/5 Inversion: 5/5  Leg strength  Quad: 5/5 Hamstring: 5/5 Hip flexor: 5/5 Hip abductors: 5/5  Gait unremarkable.  Impression and Recommendations:    Acute low back pain  Discogenic low back pain, likely lumbar strain versus discogenic pain. Toradol 30, Solu-Medrol intramuscular 125 mg. Prednisone, formal PT, x-rays, return to see me in 6 weeks.   ___________________________________________ Gwen Her. Dianah Field, M.D., ABFM., CAQSM. Primary Care and Sports Medicine  [facility]  Adjunct Professor of [employer]

## 2018-09-26 NOTE — Assessment & Plan Note (Signed)
Discogenic low back pain, likely lumbar strain versus discogenic pain. Toradol 30, Solu-Medrol intramuscular 125 mg. Prednisone, formal PT, x-rays, return to see me in 6 weeks.

## 2018-09-26 NOTE — Therapy (Signed)
[place], [place], [address] Phone: [phone]   Fax:  [phone]  Physical Therapy Treatment  Patient Details  Name: Jasmine Good MRN: [id number] Date of Birth: [birth year] Referring Provider (PT): Dr Dianah Field   Encounter Date: 09/26/2018  PT End of Session - 09/26/18 1556    Visit Number  1    Number of Visits  12    Date for PT Re-Evaluation  11/07/18    PT Start Time  0246    PT Stop Time  0340    PT Time Calculation (min)  54 min    Activity Tolerance  Patient tolerated treatment well       Past Medical History:  Diagnosis Date  . Hemoglobin low   . Uterine fibroid     Past Surgical History:  Procedure Laterality Date  . COLONOSCOPY WITH PROPOFOL N/A 06/11/2017   Procedure: COLONOSCOPY WITH PROPOFOL;  Surgeon: [name], MD;  Location: [place];  Service: Endoscopy;  Laterality: N/A;  . POLYPECTOMY  06/11/2017   Procedure: POLYPECTOMY;  Surgeon: [name], MD;  Location: [place];  Service: Endoscopy;;  . UTERINE FIBROID SURGERY  08/2001    There were no vitals filed for this visit.  Subjective Assessment - 09/26/18 1459    Subjective  Patient reports that she reached down into the cabinet this morning and felt sharp pain in the LB bilat. Pain has continued. Remembers reaching up to tag an item at work yesterday which may have irritated her LB,    Pertinent History  No hx of LBP; denies medical problems    Diagnostic tests  xrays    Patient Stated Goals  get rid of LBP    Currently in Pain?  Yes    Pain Score  7     Pain Location  Back    Pain Orientation  Lower;Right;Left    Pain Descriptors / Indicators  Aching    Pain Type  Acute pain    Pain Onset  Today    Pain Frequency  Intermittent    Aggravating Factors   reaching down; sit to stand; getting in and out of car    Pain Relieving Factors  injection this am; walking; icy hot at home          [facility] PT Assessment - 09/26/18 0001      Assessment   Medical Diagnosis  Acute bilat LBP     Referring Provider (PT)  Dr Dianah Field    Onset Date/Surgical Date  09/26/18    Hand Dominance  Right    Next MD Visit  11/07/2018    Prior Therapy  no       Precautions   Precautions  None      Balance Screen   Has the patient fallen in the past 6 months  No    Has the patient had a decrease in activity level because of a fear of falling?   No    Is the patient reluctant to leave their home because of a fear of falling?   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     Leisure  household chores; walking 2x/wk; exercises 3x/wk ~ 30 min       Observation/Other Assessments   Focus on Therapeutic Outcomes (FOTO)  61% limitation       Sensation   Additional Comments  WFL's per pt report       Posture/Postural Control   Posture Comments  flexed forward at hips in standing       AROM   Lumbar Flexion  30% increased pain     Lumbar Extension  60% no pain     Lumbar - Right Side Bend  75%     Lumbar - Left Side Bend  75%     Lumbar - Right Rotation  50%    Lumbar - Left Rotation  50%       Strength   Overall Strength Comments  WFL's lift onto toes and heels in standing       Flexibility   Hamstrings  tight with LBP Rt at ~ 75 deg; WFL's lt 90 deg    Quadriceps  tight Lt     Piriformis  tight Lt > Rt       Palpation   Spinal mobility  hypomobiile lumbar PA planes    Palpation comment  muscular tightness bilat lumbar paraspinals; QL; lats       Special Tests   Other special tests  (-) slump and SLR                    [facility] Adult PT Treatment/Exercise - 09/26/18 0001      Self-Care   Self-Care  --   initiated back care education     Lumbar Exercises: Stretches   Press Ups  5 reps   2-3 sec hold      Lumbar Exercises: Quadruped    Madcat/Old Horse  10 reps   2-3 sec pause      Moist Heat Therapy   Number Minutes Moist Heat  15 Minutes    Moist Heat Location  Lumbar Spine      Electrical Stimulation   Electrical Stimulation Location  bilat lumbar to posterior/lateral hips     Electrical Stimulation Action  IFC    Electrical Stimulation Parameters  to tolerance    Electrical Stimulation Goals  Pain;Tone                  PT Long Term Goals - 09/26/18 1553      PT LONG TERM GOAL #1   Title  Decrease pain by 50-80% allowing patient to move from sit to stand and preform ADL's without difficulty    Time  6    Period  Weeks    Status  New    Target Date  11/07/18      PT LONG TERM GOAL #2   Title  Increase trunk ROM to WNL's    Time  6    Period  Weeks    Status  New    Target Date  11/07/18      PT LONG TERM GOAL #3   Title  Paient reports return to all normal activities and work tasks without pain or limitation    Time  6    Period  Weeks    Status  New    Target Date  11/07/18      PT LONG TERM GOAL #4   Title  Independent in HEP    Time  6    Period  Weeks    Status  New    Target Date  11/07/18      PT LONG TERM GOAL #5   Title  Improve FOTO to </=  29% limitation    Time  6    Period  Weeks    Status  New    Target Date  11/07/18            Plan - 09/26/18 1524    Clinical Impression Statement  Patient presents with sudden onset of severe LBP this moring when bending over to the cabinet. She has limited trunk mobility/ROM; muscular tightness bilat lumbar and posterior hips; tight HS and piriormis R; tight FABER Lt; decreased functional activity level; weak lumbar core; pain. She will benefit from PT to address problems identified.    Stability/Clinical Decision Making  Stable/Uncomplicated    Clinical Decision Making  Low    Rehab Potential  Good    PT Frequency  2x / week    PT Duration  6 weeks    PT Treatment/Interventions  Patient/family education;ADLs/Self Care  Home Management;Cryotherapy;Electrical Stimulation;Iontophoresis 4mg /ml Dexamethasone;Moist Heat;Ultrasound;Manual techniques;Neuromuscular re-education;Functional mobility training;Therapeutic activities;Therapeutic exercise;Dry needling    PT Next Visit Plan  review HEP; progress with selective stretching and core stabilization; manual work and modalities as indicated - stretch piriformis/lats/lumbar spine musculature    PT Home Exercise Plan  [id number]    Consulted and Agree with Plan of Care  Patient       Patient will benefit from skilled therapeutic intervention in order to improve the following deficits and impairments:  Pain, Increased fascial restricitons, Increased muscle spasms, Hypomobility, Decreased strength, Decreased mobility, Decreased range of motion, Decreased activity tolerance  Visit Diagnosis: Acute bilateral low back pain without sciatica - Plan: PT plan of care cert/re-cert  Other symptoms and signs involving the musculoskeletal system - Plan: PT plan of care cert/re-cert     Problem List Patient Active Problem List   Diagnosis Date Noted  . Acute low back pain 09/26/2018  . Colon cancer screening   . Benign neoplasm of transverse colon   . Iron deficiency anemia 04/06/2016  . IFG (impaired fasting glucose) 08/30/2015    Odie Edmonds Nilda Simmer PT, MPH  09/26/2018, 3:57 PM  [facility] [place], [place], [address] Phone: [phone]   Fax:  [phone]  Name: Jasmine Good MRN: [id number] Date of Birth: 1966-01-17

## 2018-10-02 ENCOUNTER — Ambulatory Visit (INDEPENDENT_AMBULATORY_CARE_PROVIDER_SITE_OTHER): Payer: 59 | Admitting: Rehabilitative and Restorative Service Providers"

## 2018-10-02 ENCOUNTER — Encounter: Payer: Self-pay | Admitting: Rehabilitative and Restorative Service Providers"

## 2018-10-02 ENCOUNTER — Other Ambulatory Visit: Payer: Self-pay

## 2018-10-02 DIAGNOSIS — R29898 Other symptoms and signs involving the musculoskeletal system: Secondary | ICD-10-CM

## 2018-10-02 DIAGNOSIS — M545 Low back pain, unspecified: Secondary | ICD-10-CM

## 2018-10-02 NOTE — Therapy (Addendum)
[place], [place], [address] Phone: [phone]   Fax:  [phone]  Physical Therapy Treatment  Patient Details  Name: Jasmine Good MRN: [id number] Date of Birth: 01/17/1966 Referring Provider (PT): Dr Dianah Field   Encounter Date: 10/02/2018  PT End of Session - 10/02/18 0804    Visit Number  2    Number of Visits  12    Date for PT Re-Evaluation  11/07/18    PT Start Time  0800    PT Stop Time  0845    PT Time Calculation (min)  45 min    Activity Tolerance  Patient tolerated treatment well       Past Medical History:  Diagnosis Date  . Hemoglobin low   . Uterine fibroid     Past Surgical History:  Procedure Laterality Date  . COLONOSCOPY WITH PROPOFOL N/A 06/11/2017   Procedure: COLONOSCOPY WITH PROPOFOL;  Surgeon: [name], MD;  Location: [place];  Service: Endoscopy;  Laterality: N/A;  . POLYPECTOMY  06/11/2017   Procedure: POLYPECTOMY;  Surgeon: [name], MD;  Location: [place];  Service: Endoscopy;;  . UTERINE FIBROID SURGERY  08/2001    There were no vitals filed for this visit.  Subjective Assessment - 10/02/18 0805    Subjective  No pain now - working on her exercises at home.    Currently in Pain?  No/denies         [facility] PT Assessment - 10/02/18 0001      Assessment   Medical Diagnosis  Acute bilat LBP     Referring Provider (PT)  Dr Dianah Field    Onset Date/Surgical Date  09/26/18    Hand Dominance  Right    Next MD Visit  11/07/2018    Prior Therapy  no       Observation/Other Assessments   Focus on Therapeutic Outcomes (FOTO)   28% limitation       AROM   Overall AROM Comments  WNL's and pain free lumbar spine                    [facility] Adult PT Treatment/Exercise - 10/02/18 0001      Lumbar Exercises: Stretches   Press Ups  10 reps   2-3 sec hold - sag at end range      Lumbar Exercises: Aerobic   Nustep  L5  x 5 min       Lumbar Exercises: Standing   Scapular Retraction  Strengthening;Both;20 reps;Theraband    Theraband Level (Scapular Retraction)  Level 3 (Green)    Row  Strengthening;Both;20 reps;Theraband    Theraband Level (Row)  Level 3 (Green)    Shoulder Extension  Strengthening;Both;20 reps;Theraband    Theraband Level (Shoulder Extension)  Level 3 (Green)      Lumbar Exercises: Quadruped   Madcat/Old Horse  5 reps   2-3 sec pause    Other Quadruped Lumbar Exercises  child's pose 20 sec x 3 reps              PT Education - 10/02/18 0826    Education Details  HEP    Person(s) Educated  Patient    Methods  Explanation;Demonstration;Tactile cues;Verbal cues;Handout    Comprehension  Verbalized understanding;Returned demonstration;Verbal cues required;Tactile cues required          PT Long Term Goals - 10/02/18 0828      PT LONG TERM GOAL #1   Title  Decrease pain by 50-80% allowing patient to move from sit to stand and preform ADL's without difficulty    Time  6    Period  Weeks    Status  Achieved      PT LONG TERM GOAL #2   Title  Increase trunk ROM to WNL's    Time  6    Period  Weeks    Status  Achieved      PT LONG TERM GOAL #3   Title  Paient reports return to all normal activities and work tasks without pain or limitation    Time  6    Period  Weeks    Status  Achieved      PT LONG TERM GOAL #4   Title  Independent in HEP    Time  6    Period  Weeks    Status  Achieved      PT LONG TERM GOAL #5   Title  Improve FOTO to </= 29% limitation    Time  6    Period  Weeks    Status  Achieved            Plan - 10/02/18 0804    Clinical Impression Statement  Excellent improvement since initial visit. Working on [employer] and has returned to normal functional activities. Added stretching and core stabilization exercises without difficulty.Patient will call with any questions or problems or if she needs to schedule additional appointments     Stability/Clinical Decision Making  Stable/Uncomplicated    Rehab Potential  Good    PT Frequency  2x / week    PT Duration  6 weeks    PT Treatment/Interventions  Patient/family education;ADLs/Self Care Home Management;Cryotherapy;Electrical Stimulation;Iontophoresis 12m/ml Dexamethasone;Moist Heat;Ultrasound;Manual techniques;Neuromuscular re-education;Functional mobility training;Therapeutic activities;Therapeutic exercise;Dry needling    PT Next Visit Plan  review HEP; progress with selective stretching and core stabilization; manual work and modalities as indicated - stretch piriformis/lats/lumbar spine musculature - patient will continue with independent HEP and call with any questions or problems    PT Home Exercise Plan  [id number]   Consulted and Agree with Plan of Care  Patient       Patient will benefit from skilled therapeutic intervention in order to improve the following deficits and impairments:  Pain, Increased fascial restricitons, Increased muscle spasms, Hypomobility, Decreased strength, Decreased mobility, Decreased range of motion, Decreased activity tolerance  Visit Diagnosis: Acute bilateral low back pain without sciatica  Other symptoms and signs involving the musculoskeletal system     Problem List Patient Active Problem List   Diagnosis Date Noted  . Acute low back pain 09/26/2018  . Colon cancer screening   . Benign neoplasm of transverse colon   . Iron deficiency anemia 04/06/2016  . IFG (impaired fasting glucose) 08/30/2015    Jasmine Good, MPH  10/02/2018, 8:41 AM  [facility] [address] [address]Phone: [phone]  Fax:  [phone] Name: Jasmine RAILSBACKMRN: [id number] of Birth: 108-12-1965 PHYSICAL THERAPY DISCHARGE SUMMARY  Visits from Start of Care: 2  Current functional level related to goals / functional outcomes: See last progress note for discharge  status    Remaining deficits: Unknown    Education / Equipment: HEP  Plan: Patient agrees to discharge.  Patient goals were partially met. Patient is being discharged due to not returning since the last visit.  ?????    Jasmine P. HHelene Good,  MPH 05/05/19 2:46 PM

## 2018-10-02 NOTE — Patient Instructions (Signed)
Access Code: [email or web address]  URL: [email or web address]  Date: 10/02/2018  Prepared by: Gillermo Murdoch   Exercises  Prone Press Up - 5-10 reps - 1 sets - 2 sec hold - 2-3x daily - 7x weekly  Cat Cow - 5 reps - 1 sets - 2-3 sec hold - 2-3x daily - 7x weekly  Cat to Child's Pose with Posterior Pelvic Tilt - 3 reps - 1 sets - 20 sec hold - 1x daily - 7x weekly  Bird Dog - 10 reps - 1 sets - 2-3 sec hold - 1x daily - 7x weekly  Supine Transversus Abdominis Bracing - Hands on Stomach - 10 reps - 1 sets - 10 sec hold - 5-6x daily - 7x weekly  Supine Bridge - 10 reps - 1-2 sets - 5 sec hold - 1x daily - 7x weekly  Hooklying Isometric Clamshell - 10 reps - 1-2 sets - 2-3 sec hold - 1x daily - 7x weekly  Sit to Stand - 10 reps - 1 sets - 30 sec hold - 1x daily - 7x weekly  Shoulder External Rotation and Scapular Retraction with Resistance - 10 reps - 3 sets - 2x daily - 7x weekly  Scapular Retraction with Resistance - 10 reps - 3 sets - 2x daily - 7x weekly  Scapular Retraction with Resistance Advanced - 10 reps - 3 sets - 2x daily - 7x weekly

## 2018-10-04 ENCOUNTER — Encounter: Payer: 59 | Admitting: Physical Therapy

## 2018-10-07 ENCOUNTER — Encounter: Payer: 59 | Admitting: Physical Therapy

## 2018-10-09 ENCOUNTER — Encounter: Payer: 59 | Admitting: Physical Therapy

## 2018-11-07 ENCOUNTER — Encounter: Payer: Self-pay | Admitting: Sports Medicine

## 2018-11-07 ENCOUNTER — Other Ambulatory Visit: Payer: Self-pay

## 2018-11-07 ENCOUNTER — Ambulatory Visit (INDEPENDENT_AMBULATORY_CARE_PROVIDER_SITE_OTHER): Payer: 59 | Admitting: Sports Medicine

## 2018-11-07 DIAGNOSIS — M545 Low back pain, unspecified: Secondary | ICD-10-CM

## 2018-11-07 NOTE — Assessment & Plan Note (Signed)
Now resolved after Toradol and Solu-Medrol in the office, prednisone, formal PT. Whenever she gets a twinge of pain she does the rehabilitation exercises and it goes away. Return to see me as needed.

## 2018-11-07 NOTE — Progress Notes (Signed)
Subjective:    CC: Follow-up  HPI: Jasmine Good's back feels great.  I reviewed the past medical history, family history, social history, surgical history, and allergies today and no changes were needed.  Please see the problem list section below in epic for further details.  Past Medical History: Past Medical History:  Diagnosis Date  . Hemoglobin low   . Uterine fibroid    Past Surgical History: Past Surgical History:  Procedure Laterality Date  . COLONOSCOPY WITH PROPOFOL N/A 06/11/2017   Procedure: COLONOSCOPY WITH PROPOFOL;  Surgeon: [name], MD;  Location: [place];  Service: Endoscopy;  Laterality: N/A;  . POLYPECTOMY  06/11/2017   Procedure: POLYPECTOMY;  Surgeon: [name], MD;  Location: [place];  Service: Endoscopy;;  . UTERINE FIBROID SURGERY  08/2001   Social History: Social History   Socioeconomic History  . Marital status: Single    Spouse name: Not on file  . Number of children: 0  . Years of education: Not on file  . Highest education level: Not on file  Occupational History  . Occupation: Asst Manger     Comment: Comptroller in [place]  . Financial resource strain: Not on file  . Food insecurity    Worry: Not on file    Inability: Not on file  . Transportation needs    Medical: Not on file    Non-medical: Not on file  Tobacco Use  . Smoking status: Never Smoker  . Smokeless tobacco: Never Used  Substance and Sexual Activity  . Alcohol use: No    Alcohol/week: 0.0 standard drinks  . Drug use: No  . Sexual activity: Not Currently  Lifestyle  . Physical activity    Days per week: Not on file    Minutes per session: Not on file  . Stress: Not on file  Relationships  . Social Herbalist on phone: Not on file    Gets together: Not on file    Attends religious service: Not on file    Active member of club or organization: Not on file    Attends meetings of clubs or organizations: Not on file   Relationship status: Not on file  Other Topics Concern  . Not on file  Social History Narrative    regular exercise. No regular caffeine intake.     Family History: Family History  Problem Relation Age of Onset  . Diabetes Father 19   Allergies: No Known Allergies Medications: See med rec.  Review of Systems: No fevers, chills, night sweats, weight loss, chest pain, or shortness of breath.   Objective:    General: Well Developed, well nourished, and in no acute distress.  Neuro: Alert and oriented x3, extra-ocular muscles intact, sensation grossly intact.  HEENT: Normocephalic, atraumatic, pupils equal round reactive to light, neck supple, no masses, no lymphadenopathy, thyroid nonpalpable.  Skin: Warm and dry, no rashes. Cardiac: Regular rate and rhythm, no murmurs rubs or gallops, no lower extremity edema.  Respiratory: Clear to auscultation bilaterally. Not using accessory muscles, speaking in full sentences.  Impression and Recommendations:    Acute low back pain Now resolved after Toradol and Solu-Medrol in the office, prednisone, formal PT. Whenever she gets a twinge of pain she does the rehabilitation exercises and it goes away. Return to see me as needed.   ___________________________________________ Gwen Her. Dianah Field, M.D., ABFM., CAQSM. Primary Care and Sports Medicine [facility]  Adjunct Professor of [facility] Medicine  [employer]

## 2019-02-19 ENCOUNTER — Other Ambulatory Visit: Payer: Self-pay | Admitting: Family Medicine

## 2019-02-19 DIAGNOSIS — Z1231 Encounter for screening mammogram for malignant neoplasm of breast: Secondary | ICD-10-CM

## 2019-03-06 ENCOUNTER — Ambulatory Visit: Payer: 59

## 2019-04-02 ENCOUNTER — Other Ambulatory Visit: Payer: Self-pay

## 2019-04-02 ENCOUNTER — Ambulatory Visit (INDEPENDENT_AMBULATORY_CARE_PROVIDER_SITE_OTHER): Payer: Managed Care, Other (non HMO)

## 2019-04-02 DIAGNOSIS — Z1231 Encounter for screening mammogram for malignant neoplasm of breast: Secondary | ICD-10-CM

## 2019-04-07 ENCOUNTER — Other Ambulatory Visit: Payer: Self-pay | Admitting: Family Medicine

## 2019-04-07 DIAGNOSIS — R928 Other abnormal and inconclusive findings on diagnostic imaging of breast: Secondary | ICD-10-CM

## 2019-04-18 ENCOUNTER — Other Ambulatory Visit: Payer: Self-pay | Admitting: Family Medicine

## 2019-04-18 ENCOUNTER — Other Ambulatory Visit: Payer: Self-pay

## 2019-04-18 ENCOUNTER — Ambulatory Visit
Admission: RE | Admit: 2019-04-18 | Discharge: 2019-04-18 | Disposition: A | Discharge status: Home / Self Care | Payer: Managed Care, Other (non HMO) | Source: Ambulatory Visit | Attending: Family Medicine | Admitting: Family Medicine

## 2019-04-18 DIAGNOSIS — R928 Other abnormal and inconclusive findings on diagnostic imaging of breast: Secondary | ICD-10-CM

## 2019-04-23 ENCOUNTER — Ambulatory Visit
Admission: RE | Admit: 2019-04-23 | Discharge: 2019-04-23 | Disposition: A | Discharge status: Home / Self Care | Payer: Managed Care, Other (non HMO) | Source: Ambulatory Visit | Attending: Family Medicine | Admitting: Family Medicine

## 2019-04-23 ENCOUNTER — Other Ambulatory Visit: Payer: Self-pay

## 2019-04-23 DIAGNOSIS — R928 Other abnormal and inconclusive findings on diagnostic imaging of breast: Secondary | ICD-10-CM

## 2019-06-17 ENCOUNTER — Other Ambulatory Visit (HOSPITAL_COMMUNITY)
Admission: RE | Admit: 2019-06-17 | Discharge: 2019-06-17 | Disposition: A | Discharge status: Home / Self Care | Payer: Managed Care, Other (non HMO) | Source: Ambulatory Visit | Attending: Family Medicine | Admitting: Family Medicine

## 2019-06-17 ENCOUNTER — Ambulatory Visit (INDEPENDENT_AMBULATORY_CARE_PROVIDER_SITE_OTHER): Payer: Managed Care, Other (non HMO) | Admitting: Family Medicine

## 2019-06-17 ENCOUNTER — Encounter: Payer: Self-pay | Admitting: Family Medicine

## 2019-06-17 ENCOUNTER — Other Ambulatory Visit: Payer: Self-pay

## 2019-06-17 VITALS — BP 108/59 | HR 61 | Ht 65.0 in | Wt 138.0 lb

## 2019-06-17 DIAGNOSIS — Z0000 Encounter for general adult medical examination without abnormal findings: Secondary | ICD-10-CM | POA: Insufficient documentation

## 2019-06-17 DIAGNOSIS — Z124 Encounter for screening for malignant neoplasm of cervix: Secondary | ICD-10-CM | POA: Diagnosis present

## 2019-06-17 DIAGNOSIS — D509 Iron deficiency anemia, unspecified: Secondary | ICD-10-CM

## 2019-06-17 NOTE — Progress Notes (Signed)
Subjective:     Jasmine Good is a 54 y.o. female and is here for a comprehensive physical exam. The patient reports no problems.  She lost her job back in August she had worked for [employer] for 21 years and unfortunately they went bankrupt during [place].  She said she got a new job at [employer] which is where she is still working but says she really feels like she most went through depression for about 3 or 4 months.  On her days off she would just lay on her couch which really was not like her she feels like she is much better and is through some of that.  Social History   Socioeconomic History  . Marital status: Single    Spouse name: Not on file  . Number of children: 0  . Years of education: Not on file  . Highest education level: Not on file  Occupational History  . 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 Smoking status: Never Smoker  . Smokeless tobacco: Never Used  Substance and Sexual Activity  . Alcohol use: No    Alcohol/week: 0.0 standard drinks  . Drug use: No  . Sexual activity: Not Currently  Other Topics Concern  . Not on file  Social History Narrative    regular exercise. No regular caffeine intake.     Social Determinants of Health   Financial Resource Strain:   . Difficulty of Paying Living Expenses:   Food Insecurity:   . Worried About Charity fundraiser in the Last Year:   . Arboriculturist in the Last Year:   Transportation Needs:   . Film/video editor (Medical):   Marland Kitchen Lack of Transportation (Non-Medical):   Physical Activity:   . Days of Exercise per Week:   . Minutes of Exercise per Session:   Stress:   . Feeling of Stress :   Social Connections:   . Frequency of Communication with Friends and Family:   . Frequency of Social Gatherings with Friends and Family:   . Attends Religious Services:   . Active Member of Clubs or Organizations:   . Attends Archivist Meetings:   Marland Kitchen Marital Status:    Intimate Partner Violence:   . Fear of Current or Ex-Partner:   . Emotionally Abused:   Marland Kitchen Physically Abused:   . Sexually Abused:    Health Maintenance  Topic Date Due  . PAP SMEAR-Modifier  02/20/2019  . INFLUENZA VACCINE  09/07/2019  . MAMMOGRAM  04/01/2021  . COLONOSCOPY  06/12/2027  . TETANUS/TDAP  11/20/2027  . COVID-19 Vaccine  Completed  . HIV Screening  Completed    The following portions of the patient's history were reviewed and updated as appropriate: allergies, current medications, past family history, past medical history, past social history, past surgical history and problem list.  Review of Systems A comprehensive review of systems was negative.   Objective:    BP (!) 108/59   Pulse 61   Ht 5\' 5"  (1.651 m)   Wt 138 lb (62.6 kg)   LMP 06/17/2019 (Exact Date)   SpO2 100%   BMI 22.96 kg/m  General appearance: alert, cooperative and appears stated age Head: Normocephalic, without obvious abnormality, atraumatic Eyes: conj clear, EOMI, PEERLA Ears: normal TM's and external ear canals both ears Nose: Nares normal. Septum midline. Mucosa normal. No drainage or sinus tenderness. Throat: lips, mucosa, and tongue  normal; teeth and gums normal Neck: no adenopathy, no carotid bruit, no JVD, supple, symmetrical, trachea midline and thyroid not enlarged, symmetric, no tenderness/mass/nodules Back: symmetric, no curvature. ROM normal. No CVA tenderness. Lungs: clear to auscultation bilaterally Breasts: normal appearance, no masses or tenderness Heart: regular rate and rhythm, S1, S2 normal, no murmur, click, rub or gallop Abdomen: soft, non-tender; bowel sounds normal; no masses,  no organomegaly Pelvic: cervix normal in appearance, external genitalia normal, no adnexal masses or tenderness, no cervical motion tenderness, rectovaginal septum normal, uterus normal size, shape, and consistency and vagina normal without discharge Extremities: extremities normal,  atraumatic, no cyanosis or edema Pulses: 2+ and symmetric Skin: Skin color, texture, turgor normal. No rashes or lesions Lymph nodes: Cervical, supraclavicular, and axillary nodes normal. Neurologic: Alert and oriented X 3, normal strength and tone. Normal symmetric reflexes. Normal coordination and gait    Assessment:    Healthy female exam.      Plan:     See After Visit Summary for Counseling Recommendations   Keep up a regular exercise program and make sure you are eating a healthy diet Try to eat 4 servings of dairy a day, or if you are lactose intolerant take a calcium with vitamin D daily.  Your vaccines are up to date.

## 2019-06-17 NOTE — Patient Instructions (Signed)
Health Maintenance, Female Adopting a healthy lifestyle and getting preventive care are important in promoting health and wellness. Ask your health care provider about:  The right schedule for you to have regular tests and exams.  Things you can do on your own to prevent diseases and keep yourself healthy. What should I know about diet, weight, and exercise? Eat a healthy diet   Eat a diet that includes plenty of vegetables, fruits, low-fat dairy products, and lean protein.  Do not eat a lot of foods that are high in solid fats, added sugars, or sodium. Maintain a healthy weight Body mass index (BMI) is used to identify weight problems. It estimates body fat based on height and weight. Your health care provider can help determine your BMI and help you achieve or maintain a healthy weight. Get regular exercise Get regular exercise. This is one of the most important things you can do for your health. Most adults should:  Exercise for at least 150 minutes each week. The exercise should increase your heart rate and make you sweat (moderate-intensity exercise).  Do strengthening exercises at least twice a week. This is in addition to the moderate-intensity exercise.  Spend less time sitting. Even light physical activity can be beneficial. Watch cholesterol and blood lipids Have your blood tested for lipids and cholesterol at 53 years of age, then have this test every 5 years. Have your cholesterol levels checked more often if:  Your lipid or cholesterol levels are high.  You are older than 53 years of age.  You are at high risk for heart disease. What should I know about cancer screening? Depending on your health history and family history, you may need to have cancer screening at various ages. This may include screening for:  Breast cancer.  Cervical cancer.  Colorectal cancer.  Skin cancer.  Lung cancer. What should I know about heart disease, diabetes, and high blood  pressure? Blood pressure and heart disease  High blood pressure causes heart disease and increases the risk of stroke. This is more likely to develop in people who have high blood pressure readings, are of African descent, or are overweight.  Have your blood pressure checked: ? Every 3-5 years if you are 18-39 years of age. ? Every year if you are 40 years old or older. Diabetes Have regular diabetes screenings. This checks your fasting blood sugar level. Have the screening done:  Once every three years after age 40 if you are at a normal weight and have a low risk for diabetes.  More often and at a younger age if you are overweight or have a high risk for diabetes. What should I know about preventing infection? Hepatitis B If you have a higher risk for hepatitis B, you should be screened for this virus. Talk with your health care provider to find out if you are at risk for hepatitis B infection. Hepatitis C Testing is recommended for:  Everyone born from 1945 through 1965.  Anyone with known risk factors for hepatitis C. Sexually transmitted infections (STIs)  Get screened for STIs, including gonorrhea and chlamydia, if: ? You are sexually active and are younger than 53 years of age. ? You are older than 53 years of age and your health care provider tells you that you are at risk for this type of infection. ? Your sexual activity has changed since you were last screened, and you are at increased risk for chlamydia or gonorrhea. Ask your health care provider if   you are at risk.  Ask your health care provider about whether you are at high risk for HIV. Your health care provider may recommend a prescription medicine to help prevent HIV infection. If you choose to take medicine to prevent HIV, you should first get tested for HIV. You should then be tested every 3 months for as long as you are taking the medicine. Pregnancy  If you are about to stop having your period (premenopausal) and  you may become pregnant, seek counseling before you get pregnant.  Take 400 to 800 micrograms (mcg) of folic acid every day if you become pregnant.  Ask for birth control (contraception) if you want to prevent pregnancy. Osteoporosis and menopause Osteoporosis is a disease in which the bones lose minerals and strength with aging. This can result in bone fractures. If you are 65 years old or older, or if you are at risk for osteoporosis and fractures, ask your health care provider if you should:  Be screened for bone loss.  Take a calcium or vitamin D supplement to lower your risk of fractures.  Be given hormone replacement therapy (HRT) to treat symptoms of menopause. Follow these instructions at home: Lifestyle  Do not use any products that contain nicotine or tobacco, such as cigarettes, e-cigarettes, and chewing tobacco. If you need help quitting, ask your health care provider.  Do not use street drugs.  Do not share needles.  Ask your health care provider for help if you need support or information about quitting drugs. Alcohol use  Do not drink alcohol if: ? Your health care provider tells you not to drink. ? You are pregnant, may be pregnant, or are planning to become pregnant.  If you drink alcohol: ? Limit how much you use to 0-1 drink a day. ? Limit intake if you are breastfeeding.  Be aware of how much alcohol is in your drink. In the [place] one drink equals one 12 oz bottle of beer (355 mL), one 5 oz glass of wine (148 mL), or one 1 oz glass of hard liquor (44 mL). General instructions  Schedule regular health, dental, and eye exams.  Stay current with your vaccines.  Tell your health care provider if: ? You often feel depressed. ? You have ever been abused or do not feel safe at home. Summary  Adopting a healthy lifestyle and getting preventive care are important in promoting health and wellness.  Follow your health care provider's instructions about healthy  diet, exercising, and getting tested or screened for diseases.  Follow your health care provider's instructions on monitoring your cholesterol and blood pressure. This information is not intended to replace advice given to you by your health care provider. Make sure you discuss any questions you have with your health care provider. Document Revised: 01/16/2018 Document Reviewed: 01/16/2018 Elsevier Patient Education  2020 Elsevier Inc.  

## 2019-06-18 ENCOUNTER — Encounter: Payer: Self-pay | Admitting: Family Medicine

## 2019-06-18 LAB — LIPID PANEL
Cholesterol: 152 mg/dL (ref <200)
HDL: 55 mg/dL (ref >=50)
LDL Cholesterol (Calc): 84 mg/dL (calc)
Non-HDL Cholesterol (Calc): 97 mg/dL (calc) (ref <130)
Total CHOL/HDL Ratio: 2.8 (calc) (ref <5.0)
Triglycerides: 44 mg/dL (ref <150)

## 2019-06-18 LAB — COMPLETE METABOLIC PANEL WITH GFR
AG Ratio: 1.9 (calc) (ref 1.0–2.5)
ALT: 9 U/L (ref 6–29)
AST: 12 U/L (ref 10–35)
Albumin: 4.1 g/dL (ref 3.6–5.1)
Alkaline phosphatase (APISO): 48 U/L (ref 37–153)
BUN/Creatinine Ratio: 31 (calc) — ABNORMAL HIGH (ref 6–22)
BUN: 15 mg/dL (ref 7–25)
CO2: 28 mmol/L (ref 20–32)
Calcium: 8.7 mg/dL (ref 8.6–10.4)
Chloride: 106 mmol/L (ref 98–110)
Creat: 0.48 mg/dL — ABNORMAL LOW (ref 0.50–1.05)
GFR, Est African American: 130 mL/min/{1.73_m2} (ref >=60)
GFR, Est Non African American: 112 mL/min/{1.73_m2} (ref >=60)
Globulin: 2.2 g/dL (calc) (ref 1.9–3.7)
Glucose, Bld: 88 mg/dL (ref 65–99)
Potassium: 4.3 mmol/L (ref 3.5–5.3)
Sodium: 140 mmol/L (ref 135–146)
Total Bilirubin: 0.4 mg/dL (ref 0.2–1.2)
Total Protein: 6.3 g/dL (ref 6.1–8.1)

## 2019-06-18 LAB — CYTOLOGY - PAP
Comment: NEGATIVE
Diagnosis: NEGATIVE
High risk HPV: NEGATIVE

## 2019-06-18 LAB — CBC
HCT: 36.2 % (ref 35.0–45.0)
Hemoglobin: 12 g/dL (ref 11.7–15.5)
MCH: 31.9 pg (ref 27.0–33.0)
MCHC: 33.1 g/dL (ref 32.0–36.0)
MCV: 96.3 fL (ref 80.0–100.0)
MPV: 10.5 fL (ref 7.5–12.5)
Platelets: 237 10*3/uL (ref 140–400)
RBC: 3.76 10*6/uL — ABNORMAL LOW (ref 3.80–5.10)
RDW: 11.8 % (ref 11.0–15.0)
WBC: 3.4 10*3/uL — ABNORMAL LOW (ref 3.8–10.8)

## 2019-06-18 LAB — HEMOGLOBIN A1C
Hgb A1c MFr Bld: 5.2 % of total Hgb (ref <5.7)
Mean Plasma Glucose: 103 (calc)
eAG (mmol/L): 5.7 (calc)

## 2019-06-18 LAB — IRON,TIBC AND FERRITIN PANEL
%SAT: 25 % (calc) (ref 16–45)
Ferritin: 13 ng/mL — ABNORMAL LOW (ref 16–232)
Iron: 78 ug/dL (ref 45–160)
TIBC: 315 mcg/dL (calc) (ref 250–450)

## 2019-06-30 ENCOUNTER — Encounter: Payer: Self-pay | Admitting: Osteopathic Medicine

## 2019-06-30 ENCOUNTER — Ambulatory Visit (INDEPENDENT_AMBULATORY_CARE_PROVIDER_SITE_OTHER): Payer: Managed Care, Other (non HMO) | Admitting: Osteopathic Medicine

## 2019-06-30 VITALS — BP 102/66 | HR 75 | Temp 98.5°F | Wt 136.3 lb

## 2019-06-30 DIAGNOSIS — Z3009 Encounter for other general counseling and advice on contraception: Secondary | ICD-10-CM | POA: Diagnosis not present

## 2019-06-30 DIAGNOSIS — Z113 Encounter for screening for infections with a predominantly sexual mode of transmission: Secondary | ICD-10-CM | POA: Diagnosis not present

## 2019-06-30 NOTE — Progress Notes (Signed)
Jasmine Good is a 54 y.o. female who presents to  [place] at [facility]  today, 06/30/19, seeking care for the following: . Discuss IUD - new sexual partner, still having periods, would like reliable contraception and is thinking about IUD.      ASSESSMENT & PLAN with other pertinent history/findings:  The primary encounter diagnosis was Encounter for counseling regarding contraception. A diagnosis of Routine screening for STI (sexually transmitted infection) was also pertinent to this visit.  We discussed IUD options vs Nexplanon, Depo injection, OCP, patches, barrier methods. Pt leaning toward hormonal IUD. Nulliparous and light periods, I think Mirena would be overkill.     Patient Instructions  I think the Skyla or the [facility] IUD would be a good match for you. These have lower dose hormones. Isla Pence is good for 3 years, Verdia Kuba is good for 5 years. You may have lighter periods, no periods, or irregular bleeding - so it might be difficult to tell based on your periods when you go through menopause. We can confirm menopause with blood work prior to IUD removal in 3 or 5 years, if needed.  Recommend Ibuprofen 800 mg 1 hour prior to appointment time for your insertion. If you need Valium, let me know and I'm ok to prescribe this as long as you come to your appointment with a driver.   Any other questions, please let me know!       Orders Placed This Encounter  Procedures  . C. trachomatis/N. gonorrhoeae RNA    No orders of the defined types were placed in this encounter.      Follow-up instructions: Return for IUD insertion at your convenience .                                         BP 102/66 (BP Location: Left Arm, Patient Position: Sitting, Cuff Size: Normal)   Pulse 75   Temp 98.5 F (36.9 C) (Oral)   Wt 136 lb 4.8 oz (61.8 kg)   LMP 06/17/2019 (Exact Date)   BMI 22.68 kg/m    Current Meds  Medication Sig  . Ferrous Sulfate (IRON) 325 (65 Fe) MG TABS Take by mouth daily.  . Multiple Vitamins-Calcium (ONE-A-DAY WOMENS PO) Take by mouth daily.    No results found for this or any previous visit (from the past 72 hour(s)).  No results found.  Depression screen [facility] 2/9 06/17/2019 08/05/2018 05/16/2017  Decreased Interest 0 0 0  Down, Depressed, Hopeless 0 0 0  PHQ - 2 Score 0 0 0    GAD 7 : Generalized Anxiety Score 08/05/2018 05/16/2017  Nervous, Anxious, on Edge 1 0  Control/stop worrying 0 0  Worry too much - different things 0 0  Trouble relaxing 0 0  Restless 0 0  Easily annoyed or irritable 0 0  Afraid - awful might happen 0 0  Total GAD 7 Score 1 0  Anxiety Difficulty Not difficult at all Not difficult at all      All questions at time of visit were answered - patient instructed to contact office with any additional concerns or updates.  ER/RTC precautions were reviewed with the patient.  Please note: voice recognition software was used to produce this document, and typos may escape review. Please contact Dr. Sheppard Coil for any needed clarifications.   Total encounter time: 30 minutes.

## 2019-06-30 NOTE — Patient Instructions (Signed)
I think the Skyla or the [facility] IUD would be a good match for you. These have lower dose hormones. Jasmine Good is good for 3 years, Jasmine Good is good for 5 years. You may have lighter periods, no periods, or irregular bleeding - so it might be difficult to tell based on your periods when you go through menopause. We can confirm menopause with blood work prior to IUD removal in 3 or 5 years, if needed.  Recommend Ibuprofen 800 mg 1 hour prior to appointment time for your insertion. If you need Valium, let me know and I'm ok to prescribe this as long as you come to your appointment with a driver.   Any other questions, please let me know!

## 2019-07-01 LAB — C. TRACHOMATIS/N. GONORRHOEAE RNA
C. trachomatis RNA, TMA: NOT DETECTED
N. gonorrhoeae RNA, TMA: NOT DETECTED

## 2019-07-15 ENCOUNTER — Other Ambulatory Visit: Payer: Self-pay

## 2019-07-15 ENCOUNTER — Ambulatory Visit (INDEPENDENT_AMBULATORY_CARE_PROVIDER_SITE_OTHER): Payer: Managed Care, Other (non HMO) | Admitting: Osteopathic Medicine

## 2019-07-15 ENCOUNTER — Encounter: Payer: Self-pay | Admitting: Osteopathic Medicine

## 2019-07-15 VITALS — BP 103/59 | HR 66 | Temp 98.1°F | Wt 138.1 lb

## 2019-07-15 DIAGNOSIS — N882 Stricture and stenosis of cervix uteri: Secondary | ICD-10-CM | POA: Diagnosis not present

## 2019-07-15 DIAGNOSIS — Z538 Procedure and treatment not carried out for other reasons: Secondary | ICD-10-CM | POA: Diagnosis not present

## 2019-07-15 DIAGNOSIS — Z3042 Encounter for surveillance of injectable contraceptive: Secondary | ICD-10-CM

## 2019-07-15 LAB — POCT URINE PREGNANCY: Preg Test, Ur: NEGATIVE

## 2019-07-15 MED ORDER — MEDROXYPROGESTERONE ACETATE 150 MG/ML IM SUSP
150.0000 mg | Freq: Once | INTRAMUSCULAR | Status: AC
  Administered 2019-07-15: 150 mg via INTRAMUSCULAR

## 2019-07-15 NOTE — Patient Instructions (Addendum)
Sorry we weren't able to place IUD today, but safest option was to stop procedure since there was tightness to the cervix which was not allowing passage of the instruments. If IUD is desired, I'd advise OBGYN consultation to place it under ultrasound guidance +/- cervical dilation if needed. Tight cervix (stenosis) or blockage by uterine fibroids are not uncommon.   Will start Depo-Provera injection every 3 months. If you desire other contraception, please let us know!

## 2019-07-15 NOTE — Progress Notes (Signed)
IUD PROCEDURE NOTE  PERTINENT RESULTS REVIEWED: PREGNANCY TEST PRIOR TO PROCEDURE: Negative GONORRHEA/CHLAMYDIA SCREEN: Not Available  PRIOR TO PROCEDURE: INFORMED CONSENT OBTAINED: yes SEE SCANNED DOCUMENTS ANY PRETREATMENT: ibuprofen  PHYSICAL EXAM: GYN: No lesions/ulcers to external genitalia, normal urethra, normal vaginal mucosa, physiologic discharge, cervix normal without lesions, uterus not enlarged or tender, adnexa no masses and nontender  DESCRIPTION OF PROCEDURE: Vaginal speculum placed. Cervix and proximal vagina cleaned with Betadine.Tenaculum applied at 12:00 cervical position and gentle traction applied. Sound would not penetrate beyond 3 mc before meeting resistance, I decided to abort the procedure.. Tenaculum and speculum removed.Patient tolerated procedure well. Sterile technique maintained.    The primary encounter diagnosis was Unsuccessful attempt to insert intrauterine device (IUD). Diagnoses of Encounter for Depo-Provera contraception and Cervical stenosis (uterine cervix) were also pertinent to this visit.  Meds ordered this encounter  Medications  . medroxyPROGESTERone (DEPO-PROVERA) injection 150 mg   Patient Instructions  Sorry we weren't able to place IUD today, but safest option was to stop procedure since there was tightness to the cervix which was not allowing passage of the instruments. If IUD is desired, I'd advise OBGYN consultation to place it under ultrasound guidance +/- cervical dilation if needed. Tight cervix (stenosis) or blockage by uterine fibroids are not uncommon.   Will start Depo-Provera injection every 3 months. If you desire other contraception, please let us know!     Return for recheck as needed / follow with PCP as directed. 3 mos Depo shot if desired to continue .

## 2019-09-17 ENCOUNTER — Encounter: Payer: Self-pay | Admitting: Osteopathic Medicine

## 2019-09-18 NOTE — Telephone Encounter (Signed)
Left voicemail for patient to call us back if they still need an appointment.

## 2019-10-01 ENCOUNTER — Ambulatory Visit (INDEPENDENT_AMBULATORY_CARE_PROVIDER_SITE_OTHER): Payer: Managed Care, Other (non HMO) | Admitting: Family Medicine

## 2019-10-01 VITALS — BP 98/56 | HR 76 | Ht 65.0 in | Wt 147.0 lb

## 2019-10-01 DIAGNOSIS — N938 Other specified abnormal uterine and vaginal bleeding: Secondary | ICD-10-CM

## 2019-10-01 DIAGNOSIS — Z3042 Encounter for surveillance of injectable contraceptive: Secondary | ICD-10-CM

## 2019-10-01 LAB — POCT URINE PREGNANCY: Preg Test, Ur: NEGATIVE

## 2019-10-01 MED ORDER — MEDROXYPROGESTERONE ACETATE 150 MG/ML IM SUSP
150.0000 mg | Freq: Once | INTRAMUSCULAR | Status: AC
  Administered 2019-10-01: 150 mg via INTRAMUSCULAR

## 2019-10-01 NOTE — Progress Notes (Signed)
Agree with documentation as above.  If bleeding does not desist over the next 2 weeks after the Depo shot please have her come in for appointment so that we can evaluate for this functional uterine bleeding and possible anemia.   Beatrice Lecher, MD

## 2019-10-01 NOTE — Progress Notes (Signed)
Patient is here for a Depo Provera injection. Denies chest pain, SOB, headaches, mood changes, or problems with medication.  Pt states she is currently on her menstrual cycle. Cycle started on 08/29/2019 with a medium flow. Pt states the flow has remained consistent at medium.   Advised pt to schedule appt within 2 weeks with Dr. Madilyn Fireman to check for possible anemia or other concerns with prolonged menstrual cycle.   Per Dr. Madilyn Fireman, administered Hcg test (negative) and Depo shot.  Injection administered in LUOQ (pt request). Patient tolerated injection well without complications. Pt advised to schedule next injection in 12 weeks.

## 2019-10-16 ENCOUNTER — Other Ambulatory Visit: Payer: Self-pay

## 2019-10-16 ENCOUNTER — Encounter: Payer: Self-pay | Admitting: Family Medicine

## 2019-10-16 ENCOUNTER — Ambulatory Visit (INDEPENDENT_AMBULATORY_CARE_PROVIDER_SITE_OTHER): Payer: Managed Care, Other (non HMO) | Admitting: Family Medicine

## 2019-10-16 VITALS — BP 96/58 | HR 77 | Ht 65.0 in | Wt 143.0 lb

## 2019-10-16 DIAGNOSIS — D509 Iron deficiency anemia, unspecified: Secondary | ICD-10-CM

## 2019-10-16 DIAGNOSIS — N938 Other specified abnormal uterine and vaginal bleeding: Secondary | ICD-10-CM | POA: Insufficient documentation

## 2019-10-16 LAB — IRON,TIBC AND FERRITIN PANEL
%SAT: 33 % (calc) (ref 16–45)
Ferritin: 18 ng/mL (ref 16–232)
Iron: 119 ug/dL (ref 45–160)
TIBC: 357 mcg/dL (calc) (ref 250–450)

## 2019-10-16 NOTE — Progress Notes (Signed)
Established Patient Office Visit  Subjective:  Patient ID: Jasmine Good, female    DOB: 1965/02/09  Age: 54 y.o. MRN: [id number]  CC:  Chief Complaint  Patient presents with  . Anemia    HPI Jasmine Good presents for f/u anemia.  She had a failed IUD insertion nad is now on Depo shots.    She says after her first injection of Depo she just had a long extended period of bleeding she said but by the time she got her second Depo it actually seemed to stop so she feels like she is doing okay but she is worried that her blood levels may have dropped she is still taking iron. She takes 2 pills once a day does cause a little bit of constipation but she says she sort of used to it  Past Medical History:  Diagnosis Date  . Hemoglobin low   . Uterine fibroid     Past Surgical History:  Procedure Laterality Date  . COLONOSCOPY WITH PROPOFOL N/A 06/11/2017   Procedure: COLONOSCOPY WITH PROPOFOL;  Surgeon: [name], MD;  Location: [facility];  Service: Endoscopy;  Laterality: N/A;  . POLYPECTOMY  06/11/2017   Procedure: POLYPECTOMY;  Surgeon: [name], MD;  Location: [facility];  Service: Endoscopy;;  . UTERINE FIBROID SURGERY  08/2001    Family History  Problem Relation Age of Onset  . Diabetes Father 18    Social History   Socioeconomic History  . Marital status: Single    Spouse name: Not on file  . Number of children: 0  . Years of education: Not on file  . Highest education level: Not on file  Occupational History  . Occupation: Asst Manger     Comment: [employer] in [place] Use  . Smoking status: Never Smoker  . Smokeless tobacco: Never Used  Substance and Sexual Activity  . Alcohol use: No    Alcohol/week: 0.0 standard drinks  . Drug use: No  . Sexual activity: Not Currently  Other Topics Concern  . Not on file  Social History Narrative    regular exercise. No regular caffeine intake.     Social Determinants of Health    Financial Resource Strain:   . Difficulty of Paying Living Expenses: Not on file  Food Insecurity:   . Worried About Charity fundraiser in the Last Year: Not on file  . Ran Out of Food in the Last Year: Not on file  Transportation Needs:   . Lack of Transportation (Medical): Not on file  . Lack of Transportation (Non-Medical): Not on file  Physical Activity:   . Days of Exercise per Week: Not on file  . Minutes of Exercise per Session: Not on file  Stress:   . Feeling of Stress : Not on file  Social Connections:   . Frequency of Communication with Friends and Family: Not on file  . Frequency of Social Gatherings with Friends and Family: Not on file  . Attends Religious Services: Not on file  . Active Member of Clubs or Organizations: Not on file  . Attends Archivist Meetings: Not on file  . Marital Status: Not on file  Intimate Partner Violence:   . Fear of Current or Ex-Partner: Not on file  . Emotionally Abused: Not on file  . Physically Abused: Not on file  . Sexually Abused: Not on file    Outpatient Medications Prior to Visit  Medication Sig Dispense Refill  .  Ferrous Sulfate (IRON) 325 (65 Fe) MG TABS Take by mouth daily.    . Multiple Vitamins-Calcium (ONE-A-DAY WOMENS PO) Take by mouth daily.     No facility-administered medications prior to visit.    No Known Allergies  ROS Review of Systems    Objective:    Physical Exam  BP (!) 96/58   Pulse 77   Ht 5\' 5"  (1.651 m)   Wt 143 lb (64.9 kg)   LMP 08/29/2019 (Exact Date)   SpO2 99%   BMI 23.80 kg/m  Wt Readings from Last 3 Encounters:  10/16/19 143 lb (64.9 kg)  10/01/19 147 lb (66.7 kg)  07/15/19 138 lb 1.9 oz (62.7 kg)     Health Maintenance Due  Topic Date Due  . Hepatitis C Screening  Never done    There are no preventive care reminders to display for this patient.  Lab Results  Component Value Date   TSH 0.898 07/18/2007   Lab Results  Component Value Date   WBC 3.4  (L) 06/17/2019   HGB 12.0 06/17/2019   HCT 36.2 06/17/2019   MCV 96.3 06/17/2019   PLT 237 06/17/2019   Lab Results  Component Value Date   NA 140 06/17/2019   K 4.3 06/17/2019   CO2 28 06/17/2019   GLUCOSE 88 06/17/2019   BUN 15 06/17/2019   CREATININE 0.48 (L) 06/17/2019   BILITOT 0.4 06/17/2019   ALKPHOS 42 11/05/2017   AST 12 06/17/2019   ALT 9 06/17/2019   PROT 6.3 06/17/2019   ALBUMIN 4.0 03/01/2016   CALCIUM 8.7 06/17/2019   Lab Results  Component Value Date   CHOL 152 06/17/2019   Lab Results  Component Value Date   HDL 55 06/17/2019   Lab Results  Component Value Date   LDLCALC 84 06/17/2019   Lab Results  Component Value Date   TRIG 44 06/17/2019   Lab Results  Component Value Date   CHOLHDL 2.8 06/17/2019   Lab Results  Component Value Date   HGBA1C 5.2 06/17/2019      Assessment & Plan:   Problem List Items Addressed This Visit      Genitourinary   DUB (dysfunctional uterine bleeding)    We will thus far on Depo-Provera injection we discussed continuing it for anywhere between 12 to 18 months and at that point coming off and checking hormone levels to see if she is finally in menopause.        Other   Iron deficiency anemia - Primary   Relevant Orders   Fe+TIBC+Fer     Iron deficiency anemia-recommend recheck iron levels continue with supplementation for now we can make any adjustments needed once I get her numbers back. I will not be surprised if it actually has dropped but hopefully it certainly stable from when we checked it 3 or 4 months ago.  No orders of the defined types were placed in this encounter.   Follow-up: Return in about 6 months (around 04/14/2020).    Beatrice Lecher, MD

## 2019-10-16 NOTE — Assessment & Plan Note (Signed)
We will thus far on Depo-Provera injection we discussed continuing it for anywhere between 12 to 18 months and at that point coming off and checking hormone levels to see if she is finally in menopause.

## 2019-12-25 ENCOUNTER — Ambulatory Visit (INDEPENDENT_AMBULATORY_CARE_PROVIDER_SITE_OTHER): Payer: Managed Care, Other (non HMO) | Admitting: Family Medicine

## 2019-12-25 ENCOUNTER — Other Ambulatory Visit: Payer: Self-pay

## 2019-12-25 VITALS — BP 107/61 | HR 75 | Ht 65.0 in | Wt 151.0 lb

## 2019-12-25 DIAGNOSIS — Z3042 Encounter for surveillance of injectable contraceptive: Secondary | ICD-10-CM | POA: Diagnosis not present

## 2019-12-25 DIAGNOSIS — Z23 Encounter for immunization: Secondary | ICD-10-CM

## 2019-12-25 MED ORDER — MEDROXYPROGESTERONE ACETATE 150 MG/ML IM SUSP
150.0000 mg | Freq: Once | INTRAMUSCULAR | Status: AC
Start: 2019-12-25 — End: 2019-12-25
  Administered 2019-12-25: 150 mg via INTRAMUSCULAR

## 2019-12-25 NOTE — Progress Notes (Signed)
Agree with documentation as above.   Teague Goynes, MD  

## 2019-12-25 NOTE — Progress Notes (Signed)
° °  Subjective:    Patient ID: Jasmine Good, female    DOB: 01-07-1966, 54 y.o.   MRN: [id number]  HPI Patient is here for a Depo Provera injection. Denies chest pain, shortness of breath, headaches, mood changes or problems with medication. Patient is in appropriate window.    Review of Systems     Objective:   Physical Exam        Assessment & Plan:  Patient tolerated injection well without complications. Patient advised to schedule next injection 11-14 weeks from today.   Patient also given flu shot today in left deltoid. Tolerated injection well without any immediate complications.

## 2020-03-24 ENCOUNTER — Ambulatory Visit (INDEPENDENT_AMBULATORY_CARE_PROVIDER_SITE_OTHER): Payer: Managed Care, Other (non HMO) | Admitting: Family Medicine

## 2020-03-24 ENCOUNTER — Other Ambulatory Visit: Payer: Self-pay

## 2020-03-24 VITALS — BP 93/58 | HR 79 | Ht 65.0 in | Wt 151.0 lb

## 2020-03-24 DIAGNOSIS — N938 Other specified abnormal uterine and vaginal bleeding: Secondary | ICD-10-CM | POA: Diagnosis not present

## 2020-03-24 DIAGNOSIS — Z3042 Encounter for surveillance of injectable contraceptive: Secondary | ICD-10-CM | POA: Diagnosis not present

## 2020-03-24 MED ORDER — MEDROXYPROGESTERONE ACETATE 150 MG/ML IM SUSP
150.0000 mg | Freq: Once | INTRAMUSCULAR | Status: AC
  Administered 2020-03-24: 150 mg via INTRAMUSCULAR

## 2020-03-24 NOTE — Progress Notes (Signed)
Agree with documentation as above.   Neville Pauls, MD  

## 2020-03-24 NOTE — Patient Instructions (Signed)
Return to clinic for next injection between 5/4-5/18/2022 for next injection.

## 2020-03-24 NOTE — Progress Notes (Signed)
Pt is here for a Depo Provera injection. Denies chest pain, shortness of breath, headaches, mood changes or problems with medication.  she does continue to experience the longer cycles. She continues to take Iron supplements and an MVI daily.   She is within injection window. Injection given in LUOQ and was tolerated well with no immediate complication.   Advised to scheduled next depo appointment. Informed to RTC between 5/4-5/18/2022 for next injection. No further questions or concerns.  She has a regular f/u with pcp on 06/16/2020

## 2020-06-08 ENCOUNTER — Other Ambulatory Visit: Payer: Self-pay | Admitting: Family Medicine

## 2020-06-08 DIAGNOSIS — Z1231 Encounter for screening mammogram for malignant neoplasm of breast: Secondary | ICD-10-CM

## 2020-06-16 ENCOUNTER — Encounter: Payer: Managed Care, Other (non HMO) | Admitting: Family Medicine

## 2020-06-17 ENCOUNTER — Other Ambulatory Visit: Payer: Self-pay

## 2020-06-17 ENCOUNTER — Encounter: Payer: Self-pay | Admitting: Family Medicine

## 2020-06-17 ENCOUNTER — Ambulatory Visit (INDEPENDENT_AMBULATORY_CARE_PROVIDER_SITE_OTHER): Payer: Managed Care, Other (non HMO) | Admitting: Family Medicine

## 2020-06-17 VITALS — BP 114/63 | HR 70 | Ht 65.0 in | Wt 156.0 lb

## 2020-06-17 DIAGNOSIS — Z1159 Encounter for screening for other viral diseases: Secondary | ICD-10-CM | POA: Diagnosis not present

## 2020-06-17 DIAGNOSIS — Z0000 Encounter for general adult medical examination without abnormal findings: Secondary | ICD-10-CM | POA: Diagnosis not present

## 2020-06-17 MED ORDER — DESOGESTREL-ETHINYL ESTRADIOL 0.15-0.02/0.01 MG (21/5) PO TABS: 1.0000 | ORAL_TABLET | Freq: Every day | ORAL | 4 refills | Status: DC

## 2020-06-17 NOTE — Progress Notes (Signed)
Subjective:     Jasmine Good is a 55 y.o. female and is here for a comprehensive physical exam. The patient reports no problems.  Social History   Socioeconomic History  . Marital status: Single    Spouse name: Not on file  . Number of children: 0  . Years of education: Not on file  . Highest education level: Not on file  Occupational History  . 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 Smoking status: Never Smoker  . Smokeless tobacco: Never Used  Substance and Sexual Activity  . Alcohol use: No    Alcohol/week: 0.0 standard drinks  . Drug use: No  . Sexual activity: Not Currently  Other Topics Concern  . Not on file  Social History Narrative    regular exercise. No regular caffeine intake.     Social Determinants of Health   Financial Resource Strain: Not on file  Food Insecurity: Not on file  Transportation Needs: Not on file  Physical Activity: Not on file  Stress: Not on file  Social Connections: Not on file  Intimate Partner Violence: Not on file   Health Maintenance  Topic Date Due  . Hepatitis C Screening  Never done  . COVID-19 Vaccine (2 - Booster for [employer] series) 06/15/2019  . INFLUENZA VACCINE  09/06/2020  . MAMMOGRAM  04/01/2021  . PAP SMEAR-Modifier  06/16/2024  . COLONOSCOPY (Pts 45-73yrs Insurance coverage will need to be confirmed)  06/12/2027  . TETANUS/TDAP  11/20/2027  . HIV Screening  Completed  . HPV VACCINES  Aged Out    The following portions of the patient's history were reviewed and updated as appropriate: allergies, current medications, past family history, past medical history, past social history, past surgical history and problem list.  Review of Systems A comprehensive review of systems was negative.   Objective:    BP 114/63   Pulse 70   Ht 5\' 5"  (1.651 m)   Wt 156 lb (70.8 kg)   LMP 05/09/2020 (Exact Date)   SpO2 100%   BMI 25.96 kg/m  General appearance: alert, cooperative and  appears stated age Head: Normocephalic, without obvious abnormality, atraumatic Eyes: conj clear, EOMI, PEERLA Ears: normal TM's and external ear canals both ears Nose: Nares normal. Septum midline. Mucosa normal. No drainage or sinus tenderness. Throat: lips, mucosa, and tongue normal; teeth and gums normal Neck: no adenopathy, no carotid bruit, no JVD, supple, symmetrical, trachea midline and thyroid not enlarged, symmetric, no tenderness/mass/nodules Back: symmetric, no curvature. ROM normal. No CVA tenderness. Lungs: clear to auscultation bilaterally Breasts: normal appearance, no masses or tenderness Heart: regular rate and rhythm, S1, S2 normal, no murmur, click, rub or gallop Abdomen: soft, non-tender; bowel sounds normal; no masses,  no organomegaly Extremities: extremities normal, atraumatic, no cyanosis or edema Pulses: 2+ and symmetric Skin: Skin color, texture, turgor normal. No rashes or lesions Lymph nodes: Cervical, supraclavicular, and axillary nodes normal. Neurologic: Grossly normal    Assessment:    Healthy female exam.     Plan:     See After Visit Summary for Counseling Recommendations   Keep up a regular exercise program and make sure you are eating a healthy diet Try to eat 4 servings of dairy a day, or if you are lactose intolerant take a calcium with vitamin D daily.  Your vaccines are up to date.

## 2020-06-17 NOTE — Patient Instructions (Signed)
Health Maintenance, Female Adopting a healthy lifestyle and getting preventive care are important in promoting health and wellness. Ask your health care provider about:  The right schedule for you to have regular tests and exams.  Things you can do on your own to prevent diseases and keep yourself healthy. What should I know about diet, weight, and exercise? Eat a healthy diet  Eat a diet that includes plenty of vegetables, fruits, low-fat dairy products, and lean protein.  Do not eat a lot of foods that are high in solid fats, added sugars, or sodium.   Maintain a healthy weight Body mass index (BMI) is used to identify weight problems. It estimates body fat based on height and weight. Your health care provider can help determine your BMI and help you achieve or maintain a healthy weight. Get regular exercise Get regular exercise. This is one of the most important things you can do for your health. Most adults should:  Exercise for at least 150 minutes each week. The exercise should increase your heart rate and make you sweat (moderate-intensity exercise).  Do strengthening exercises at least twice a week. This is in addition to the moderate-intensity exercise.  Spend less time sitting. Even light physical activity can be beneficial. Watch cholesterol and blood lipids Have your blood tested for lipids and cholesterol at 54 years of age, then have this test every 5 years. Have your cholesterol levels checked more often if:  Your lipid or cholesterol levels are high.  You are older than 54 years of age.  You are at high risk for heart disease. What should I know about cancer screening? Depending on your health history and family history, you may need to have cancer screening at various ages. This may include screening for:  Breast cancer.  Cervical cancer.  Colorectal cancer.  Skin cancer.  Lung cancer. What should I know about heart disease, diabetes, and high blood  pressure? Blood pressure and heart disease  High blood pressure causes heart disease and increases the risk of stroke. This is more likely to develop in people who have high blood pressure readings, are of African descent, or are overweight.  Have your blood pressure checked: ? Every 3-5 years if you are 18-39 years of age. ? Every year if you are 40 years old or older. Diabetes Have regular diabetes screenings. This checks your fasting blood sugar level. Have the screening done:  Once every three years after age 40 if you are at a normal weight and have a low risk for diabetes.  More often and at a younger age if you are overweight or have a high risk for diabetes. What should I know about preventing infection? Hepatitis B If you have a higher risk for hepatitis B, you should be screened for this virus. Talk with your health care provider to find out if you are at risk for hepatitis B infection. Hepatitis C Testing is recommended for:  Everyone born from 1945 through 1965.  Anyone with known risk factors for hepatitis C. Sexually transmitted infections (STIs)  Get screened for STIs, including gonorrhea and chlamydia, if: ? You are sexually active and are younger than 54 years of age. ? You are older than 54 years of age and your health care provider tells you that you are at risk for this type of infection. ? Your sexual activity has changed since you were last screened, and you are at increased risk for chlamydia or gonorrhea. Ask your health care provider   if you are at risk.  Ask your health care provider about whether you are at high risk for HIV. Your health care provider may recommend a prescription medicine to help prevent HIV infection. If you choose to take medicine to prevent HIV, you should first get tested for HIV. You should then be tested every 3 months for as long as you are taking the medicine. Pregnancy  If you are about to stop having your period (premenopausal) and  you may become pregnant, seek counseling before you get pregnant.  Take 400 to 800 micrograms (mcg) of folic acid every day if you become pregnant.  Ask for birth control (contraception) if you want to prevent pregnancy. Osteoporosis and menopause Osteoporosis is a disease in which the bones lose minerals and strength with aging. This can result in bone fractures. If you are 65 years old or older, or if you are at risk for osteoporosis and fractures, ask your health care provider if you should:  Be screened for bone loss.  Take a calcium or vitamin D supplement to lower your risk of fractures.  Be given hormone replacement therapy (HRT) to treat symptoms of menopause. Follow these instructions at home: Lifestyle  Do not use any products that contain nicotine or tobacco, such as cigarettes, e-cigarettes, and chewing tobacco. If you need help quitting, ask your health care provider.  Do not use street drugs.  Do not share needles.  Ask your health care provider for help if you need support or information about quitting drugs. Alcohol use  Do not drink alcohol if: ? Your health care provider tells you not to drink. ? You are pregnant, may be pregnant, or are planning to become pregnant.  If you drink alcohol: ? Limit how much you use to 0-1 drink a day. ? Limit intake if you are breastfeeding.  Be aware of how much alcohol is in your drink. In the [place] one drink equals one 12 oz bottle of beer (355 mL), one 5 oz glass of wine (148 mL), or one 1 oz glass of hard liquor (44 mL). General instructions  Schedule regular health, dental, and eye exams.  Stay current with your vaccines.  Tell your health care provider if: ? You often feel depressed. ? You have ever been abused or do not feel safe at home. Summary  Adopting a healthy lifestyle and getting preventive care are important in promoting health and wellness.  Follow your health care provider's instructions about healthy  diet, exercising, and getting tested or screened for diseases.  Follow your health care provider's instructions on monitoring your cholesterol and blood pressure. This information is not intended to replace advice given to you by your health care provider. Make sure you discuss any questions you have with your health care provider. Document Revised: 01/16/2018 Document Reviewed: 01/16/2018 Elsevier Patient Education  2021 Elsevier Inc.  

## 2020-06-18 LAB — LIPID PANEL
Cholesterol: 171 mg/dL (ref <200)
HDL: 49 mg/dL — ABNORMAL LOW (ref >=50)
LDL Cholesterol (Calc): 109 mg/dL (calc) — ABNORMAL HIGH
Non-HDL Cholesterol (Calc): 122 mg/dL (calc) (ref <130)
Total CHOL/HDL Ratio: 3.5 (calc) (ref <5.0)
Triglycerides: 43 mg/dL (ref <150)

## 2020-06-18 LAB — IRON,TIBC AND FERRITIN PANEL
%SAT: 31 % (calc) (ref 16–45)
Ferritin: 21 ng/mL (ref 16–232)
Iron: 101 ug/dL (ref 45–160)
TIBC: 329 mcg/dL (calc) (ref 250–450)

## 2020-06-18 LAB — COMPLETE METABOLIC PANEL WITH GFR
AG Ratio: 1.7 (calc) (ref 1.0–2.5)
ALT: 11 U/L (ref 6–29)
AST: 14 U/L (ref 10–35)
Albumin: 4.5 g/dL (ref 3.6–5.1)
Alkaline phosphatase (APISO): 53 U/L (ref 37–153)
BUN: 15 mg/dL (ref 7–25)
CO2: 24 mmol/L (ref 20–32)
Calcium: 9.6 mg/dL (ref 8.6–10.4)
Chloride: 106 mmol/L (ref 98–110)
Creat: 0.53 mg/dL (ref 0.50–1.05)
GFR, Est African American: 125 mL/min/{1.73_m2} (ref >=60)
GFR, Est Non African American: 108 mL/min/{1.73_m2} (ref >=60)
Globulin: 2.6 g/dL (calc) (ref 1.9–3.7)
Glucose, Bld: 84 mg/dL (ref 65–99)
Potassium: 4.3 mmol/L (ref 3.5–5.3)
Sodium: 139 mmol/L (ref 135–146)
Total Bilirubin: 0.4 mg/dL (ref 0.2–1.2)
Total Protein: 7.1 g/dL (ref 6.1–8.1)

## 2020-06-18 LAB — CBC
HCT: 37.1 % (ref 35.0–45.0)
Hemoglobin: 12.3 g/dL (ref 11.7–15.5)
MCH: 30.4 pg (ref 27.0–33.0)
MCHC: 33.2 g/dL (ref 32.0–36.0)
MCV: 91.6 fL (ref 80.0–100.0)
MPV: 10.6 fL (ref 7.5–12.5)
Platelets: 271 10*3/uL (ref 140–400)
RBC: 4.05 10*6/uL (ref 3.80–5.10)
RDW: 12 % (ref 11.0–15.0)
WBC: 5 10*3/uL (ref 3.8–10.8)

## 2020-06-18 LAB — HEPATITIS C ANTIBODY
Hepatitis C Ab: NONREACTIVE
SIGNAL TO CUT-OFF: 0.02 (ref <1.00)

## 2020-07-29 ENCOUNTER — Ambulatory Visit
Admission: RE | Admit: 2020-07-29 | Discharge: 2020-07-29 | Disposition: A | Discharge status: Home / Self Care | Payer: Managed Care, Other (non HMO) | Source: Ambulatory Visit | Attending: Family Medicine | Admitting: Family Medicine

## 2020-07-29 ENCOUNTER — Other Ambulatory Visit: Payer: Self-pay

## 2020-07-29 ENCOUNTER — Other Ambulatory Visit: Payer: Self-pay | Admitting: Family Medicine

## 2020-07-29 DIAGNOSIS — Z1231 Encounter for screening mammogram for malignant neoplasm of breast: Secondary | ICD-10-CM

## 2020-07-29 DIAGNOSIS — R921 Mammographic calcification found on diagnostic imaging of breast: Secondary | ICD-10-CM

## 2020-08-25 ENCOUNTER — Ambulatory Visit
Admission: RE | Admit: 2020-08-25 | Discharge: 2020-08-25 | Disposition: A | Discharge status: Home / Self Care | Payer: Managed Care, Other (non HMO) | Source: Ambulatory Visit | Attending: Family Medicine | Admitting: Family Medicine

## 2020-08-25 ENCOUNTER — Other Ambulatory Visit: Payer: Self-pay

## 2020-08-25 DIAGNOSIS — R921 Mammographic calcification found on diagnostic imaging of breast: Secondary | ICD-10-CM

## 2021-06-15 ENCOUNTER — Ambulatory Visit (INDEPENDENT_AMBULATORY_CARE_PROVIDER_SITE_OTHER): Payer: BC Managed Care – PPO | Admitting: Family Medicine

## 2021-06-15 ENCOUNTER — Encounter: Payer: Self-pay | Admitting: Family Medicine

## 2021-06-15 VITALS — BP 104/67 | HR 67 | Resp 16 | Ht 65.0 in | Wt 143.0 lb

## 2021-06-15 DIAGNOSIS — Z3042 Encounter for surveillance of injectable contraceptive: Secondary | ICD-10-CM | POA: Diagnosis not present

## 2021-06-15 DIAGNOSIS — Z0000 Encounter for general adult medical examination without abnormal findings: Secondary | ICD-10-CM | POA: Diagnosis not present

## 2021-06-15 NOTE — Progress Notes (Signed)
? ?Complete physical exam ? ?Patient: Jasmine Good   DOB: Oct 20, 1965   56 y.o. Female  MRN: [id number] ? ?Subjective:  ?  ?Chief Complaint  ?Patient presents with  ? Annual Exam  ?  Fasting   ? ? ?Jasmine Good is a 56 y.o. female who presents today for a complete physical exam. She reports consuming a general diet. The patient does not participate in regular exercise at present. She generally feels well. She reports sleeping fairly well. She does have additional problems to discuss today.  She has a new job at [employer] and says she is learning a lot of new things and enjoys it. ? ? ?Most recent fall risk assessment: ? ?  06/15/2021  ? 11:19 AM  ?Fall Risk   ?Falls in the past year? 0  ?Number falls in past yr: 0  ?Injury with Fall? 0  ?Risk for fall due to : No Fall Risks  ?Follow up Falls prevention discussed;Falls evaluation completed  ? ?  ?Most recent depression screenings: ? ?  06/15/2021  ? 11:19 AM 06/17/2020  ?  1:52 PM  ?PHQ 2/9 Scores  ?PHQ - 2 Score 0 0  ? ? ? ? ? ? ?Patient Care Team: ?Hali Marry, MD as PCP - General  ? ?Outpatient Medications Prior to Visit  ?Medication Sig  ? desogestrel-ethinyl estradiol (KARIVA) 0.15-0.02/0.01 MG (21/5) tablet Take 1 tablet by mouth daily.  ? Ferrous Sulfate (IRON) 325 (65 Fe) MG TABS Take by mouth daily.  ? Multiple Vitamins-Calcium (ONE-A-DAY WOMENS PO) Take by mouth daily.  ? ?No facility-administered medications prior to visit.  ? ? ?ROS ? ? ? ? ?   ?Objective:  ? ?  ?BP 104/67   Pulse 67   Resp 16   Ht '5\' 5"'$  (1.651 m)   Wt 143 lb (64.9 kg)   LMP 06/14/2021   SpO2 98%   BMI 23.80 kg/m?  ? ? ?Physical Exam ?Constitutional:   ?   Appearance: She is well-developed.  ?HENT:  ?   Head: Normocephalic and atraumatic.  ?   Right Ear: External ear normal.  ?   Left Ear: External ear normal.  ?   Nose: Nose normal.  ?Eyes:  ?   Conjunctiva/sclera: Conjunctivae normal.  ?   Pupils: Pupils are equal, round, and reactive to light.  ?Neck:  ?   Thyroid:  No thyromegaly.  ?Cardiovascular:  ?   Rate and Rhythm: Normal rate and regular rhythm.  ?   Heart sounds: Normal heart sounds.  ?Pulmonary:  ?   Effort: Pulmonary effort is normal.  ?   Breath sounds: Normal breath sounds. No wheezing.  ?Abdominal:  ?   General: Abdomen is flat. Bowel sounds are normal.  ?   Palpations: Abdomen is soft.  ?Musculoskeletal:  ?   Cervical back: Neck supple.  ?Lymphadenopathy:  ?   Cervical: No cervical adenopathy.  ?Skin: ?   General: Skin is warm and dry.  ?Neurological:  ?   Mental Status: She is alert and oriented to person, place, and time.  ?  ? ?No results found for any visits on 06/15/21. ? ?   ?Assessment & Plan:  ?  ?Routine Health Maintenance and Physical Exam ? ?Immunization History  ?Administered Date(s) Administered  ? Influenza Split 11/10/2010  ? Influenza,inj,Quad PF,6+ Mos 01/06/2013, 03/02/2015, 03/01/2016, 11/19/2017, 12/25/2019  ? Janssen (J&J) SARS-COV-2 Vaccination 04/20/2019  ? Td 07/17/2007  ? Tdap 11/19/2017  ? Zoster Recombinat (  Shingrix) 12/27/2017, 02/27/2018  ? ? ?Health Maintenance  ?Topic Date Due  ? COVID-19 Vaccine (2 - Booster for Janssen series) 07/01/2021 (Originally 06/15/2019)  ? INFLUENZA VACCINE  09/06/2021  ? MAMMOGRAM  08/26/2022  ? PAP SMEAR-Modifier  06/16/2024  ? COLONOSCOPY (Pts 45-62yr Insurance coverage will need to be confirmed)  06/12/2027  ? TETANUS/TDAP  11/20/2027  ? Hepatitis C Screening  Completed  ? HIV Screening  Completed  ? Zoster Vaccines- Shingrix  Completed  ? Pneumococcal Vaccine [id number] old  Aged Out  ? HPV VACCINES  Aged Out  ? ? ?Discussed health benefits of physical activity, and encouraged her to engage in regular exercise appropriate for her age and condition. ? ?Problem List Items Addressed This Visit   ?None ?Visit Diagnoses   ? ? Wellness examination    -  Primary  ? Relevant Orders  ? Fe+TIBC+Fer  ? CBC  ? Lipid Panel w/reflex Direct LDL  ? COMPLETE METABOLIC PANEL WITH GFR  ? HgB A1c  ? Surveillance of  contraceptive injection      ? ?  ? ? ?Keep up a regular exercise program and make sure you are eating a healthy diet ?Try to eat 4 servings of dairy a day, or if you are lactose intolerant take a calcium with vitamin D daily.  ?Your vaccines are up to date.  ?We did discuss coming off her birth control as well she thinks she might have about a month left of the medication she is okay to finish that out and then come off for the next several months to see if she has a period without it we can always do blood levels if needed.  Did encourage her to use backup method such as condoms until we know for sure that she is truly postmenopausal.  If she starts to experience some significant hot flashes or menopausal symptoms we can always discuss that further. ? ?Return in about 1 year (around 06/16/2022) for annual exam . ? ?  ? ?CBeatrice Lecher MD ? ? ?

## 2021-06-16 ENCOUNTER — Encounter: Payer: Self-pay | Admitting: Family Medicine

## 2021-06-16 LAB — LIPID PANEL W/REFLEX DIRECT LDL
Cholesterol: 160 mg/dL (ref <200)
HDL: 60 mg/dL (ref >=50)
LDL Cholesterol (Calc): 86 mg/dL (calc)
Non-HDL Cholesterol (Calc): 100 mg/dL (calc) (ref <130)
Total CHOL/HDL Ratio: 2.7 (calc) (ref <5.0)
Triglycerides: 49 mg/dL (ref <150)

## 2021-06-16 LAB — CBC
HCT: 37.4 % (ref 35.0–45.0)
Hemoglobin: 12.5 g/dL (ref 11.7–15.5)
MCH: 30.7 pg (ref 27.0–33.0)
MCHC: 33.4 g/dL (ref 32.0–36.0)
MCV: 91.9 fL (ref 80.0–100.0)
MPV: 9.9 fL (ref 7.5–12.5)
Platelets: 288 10*3/uL (ref 140–400)
RBC: 4.07 10*6/uL (ref 3.80–5.10)
RDW: 11.5 % (ref 11.0–15.0)
WBC: 4.1 10*3/uL (ref 3.8–10.8)

## 2021-06-16 LAB — COMPLETE METABOLIC PANEL WITH GFR
AG Ratio: 1.6 (calc) (ref 1.0–2.5)
ALT: 9 U/L (ref 6–29)
AST: 12 U/L (ref 10–35)
Albumin: 4.1 g/dL (ref 3.6–5.1)
Alkaline phosphatase (APISO): 40 U/L (ref 37–153)
BUN: 13 mg/dL (ref 7–25)
CO2: 26 mmol/L (ref 20–32)
Calcium: 9.1 mg/dL (ref 8.6–10.4)
Chloride: 103 mmol/L (ref 98–110)
Creat: 0.52 mg/dL (ref 0.50–1.03)
Globulin: 2.5 g/dL (calc) (ref 1.9–3.7)
Glucose, Bld: 78 mg/dL (ref 65–99)
Potassium: 4.8 mmol/L (ref 3.5–5.3)
Sodium: 139 mmol/L (ref 135–146)
Total Bilirubin: 0.4 mg/dL (ref 0.2–1.2)
Total Protein: 6.6 g/dL (ref 6.1–8.1)
eGFR: 110 mL/min/{1.73_m2} (ref >=60)

## 2021-06-16 LAB — IRON,TIBC AND FERRITIN PANEL
%SAT: 30 % (calc) (ref 16–45)
Ferritin: 39 ng/mL (ref 16–232)
Iron: 111 ug/dL (ref 45–160)
TIBC: 367 mcg/dL (calc) (ref 250–450)

## 2021-06-16 LAB — HEMOGLOBIN A1C
Hgb A1c MFr Bld: 5.7 % of total Hgb — ABNORMAL HIGH (ref <5.7)
Mean Plasma Glucose: 117 mg/dL
eAG (mmol/L): 6.5 mmol/L

## 2021-06-16 NOTE — Progress Notes (Signed)
Hi Anjolaoluwa, A1c back into the prediabetes range similar to about 5 years ago.  I sent you a separate note about working on exercise and diet and then rechecking again in 4 months.  Your iron levels are improving.  Ferritin is up to 39 which is awesome.  Ultimately would like to get it above 40.  Your blood count is normal.  No anemia.  Your cholesterol looks great with an LDL under 100.  Your metabolic panel including liver and kidney function looks great.

## 2021-07-01 NOTE — Progress Notes (Unsigned)
Received Epic notification that pt has not read MyChart message regarding results.    Question regarding HEMOGLOBIN A1C  From  Hali Marry, MD To  [place] and Delivered  06/16/2021  5:46 PM     Hi Levada Dy, it was a bit of a jump.  It is just right on the cusp of prediabetes at 5.7.  I usually just encouraged to continue to work on cutting back on sugars and carbs such as breads, rice, pastas.  You can eat them but just really monitor your portion size.  Try to increase her vegetable intake.  We can recheck it again in about 4 months to see if maybe it is back under 5.7.  I do not usually recommend starting any type of medication for treatment unless her A1c is greater then 6.2.  Also just encouraged her to continue to work on regular exercise that can make a big difference in your daily glucose levels without having to make dramatic changes to your diet as well.  By the way, your iron levels do look better!   Dr. Jerilynn Mages     Previous Messages ----- Message -----       From:Amnah Sherlynn Stalls       Sent:06/16/2021  2:56 PM EDT         PY:[id number] Madilyn Fireman, MD    Subject:Question regarding HEMOGLOBIN A1C   I see that A1c is back in the prediabetes stage, what would you suggest to get the level back down to normal?   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  Charyl Bigger, CMA

## 2021-07-05 NOTE — Progress Notes (Signed)
Last read by Altamese Dilling at  9:19 PM on 07/01/2021.

## 2021-08-03 ENCOUNTER — Other Ambulatory Visit: Payer: Self-pay | Admitting: Family Medicine

## 2021-08-03 DIAGNOSIS — Z09 Encounter for follow-up examination after completed treatment for conditions other than malignant neoplasm: Secondary | ICD-10-CM

## 2021-08-03 DIAGNOSIS — R921 Mammographic calcification found on diagnostic imaging of breast: Secondary | ICD-10-CM

## 2021-08-12 ENCOUNTER — Other Ambulatory Visit: Payer: Self-pay | Admitting: Family Medicine

## 2021-08-20 IMAGING — MG DIGITAL DIAGNOSTIC BILAT W/ TOMO W/ CAD
8 of 11 series · 8 of 27 positions shown · non-contrast
Comparison: Previous exam(s).

CLINICAL DATA: Short-term follow-up recommended after stereotactic
core needle biopsy of right breast calcifications. Biopsy revealed
fibrocystic change with calcifications, which was concordant.

EXAM:
DIGITAL DIAGNOSTIC BILATERAL MAMMOGRAM WITH TOMOSYNTHESIS AND CAD
TECHNIQUE: Bilateral digital diagnostic mammography and breast tomosynthesis
was performed. The images were evaluated with computer-aided
detection.

[R ML (1 of 2)]
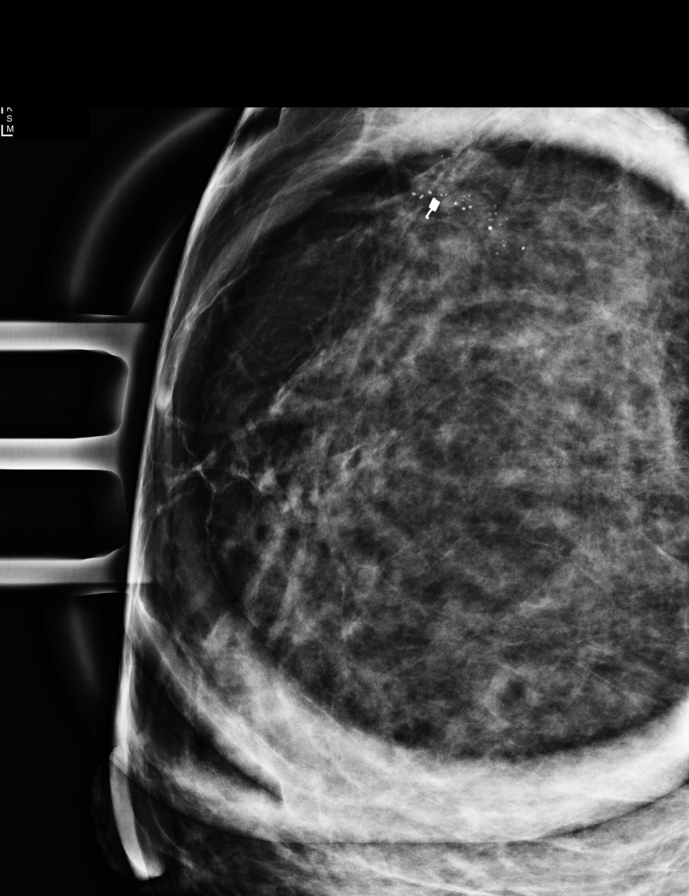

[R CC]
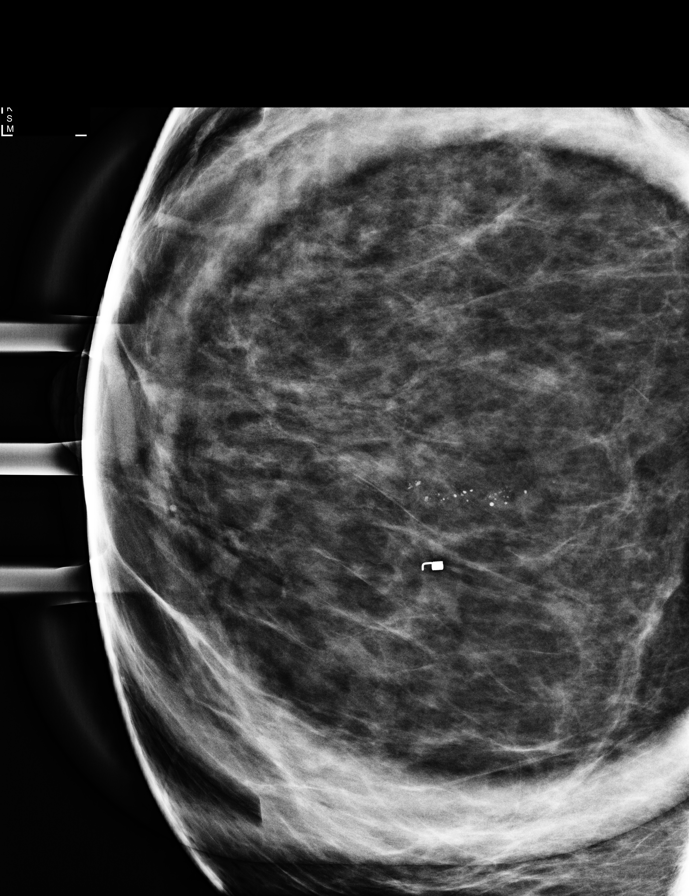

[R ML (2 of 2)]
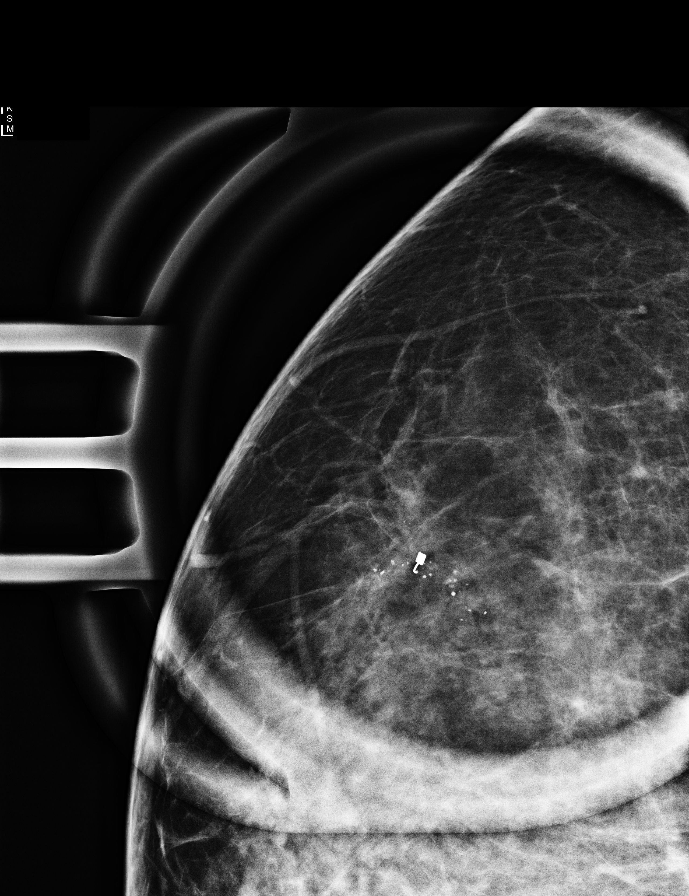

[R MLO synth-2D]
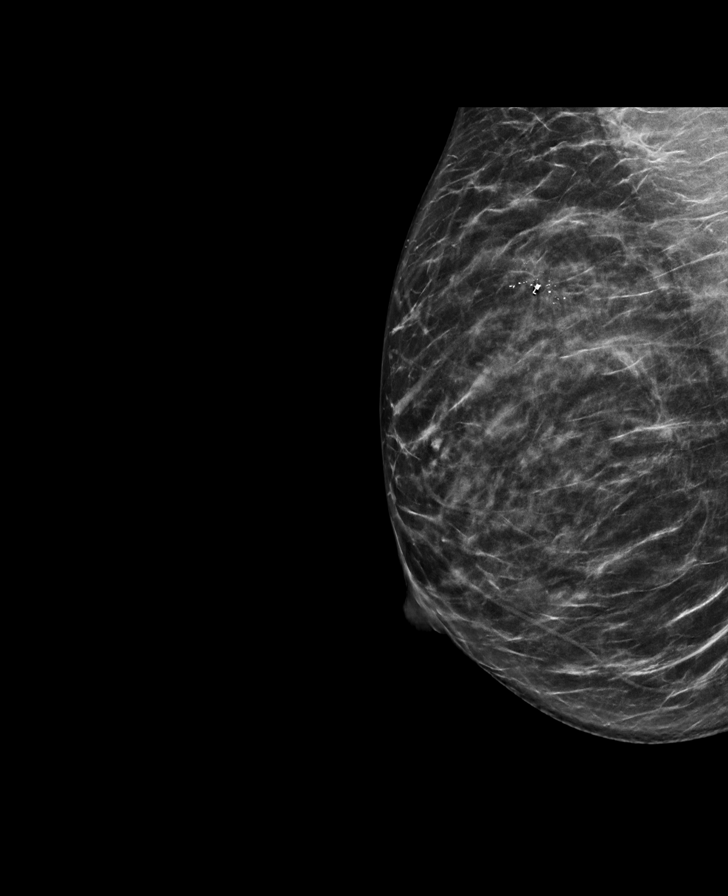

[L MLO synth-2D]
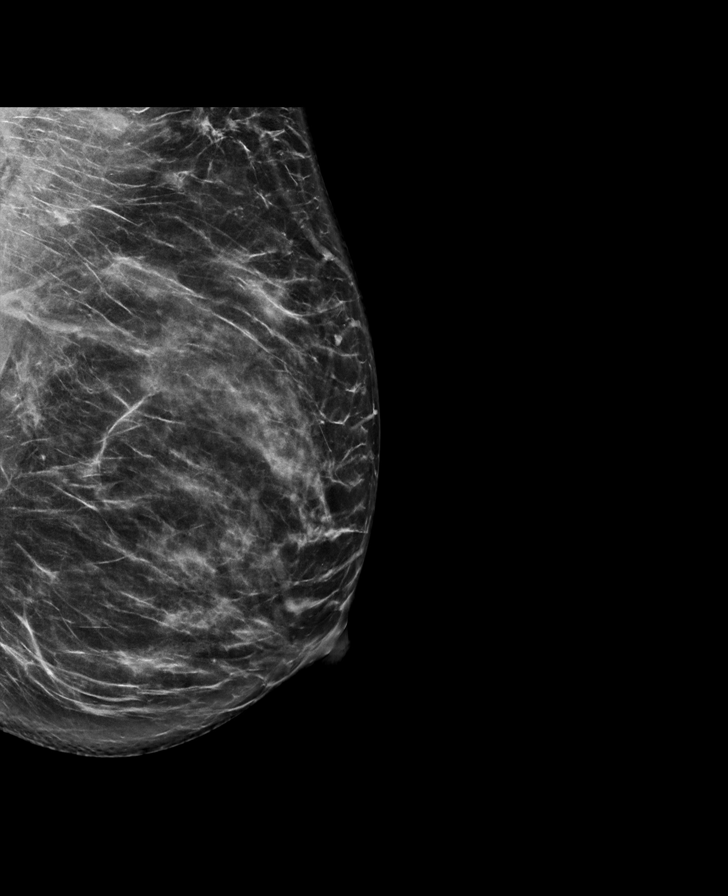

[R CC synth-2D]
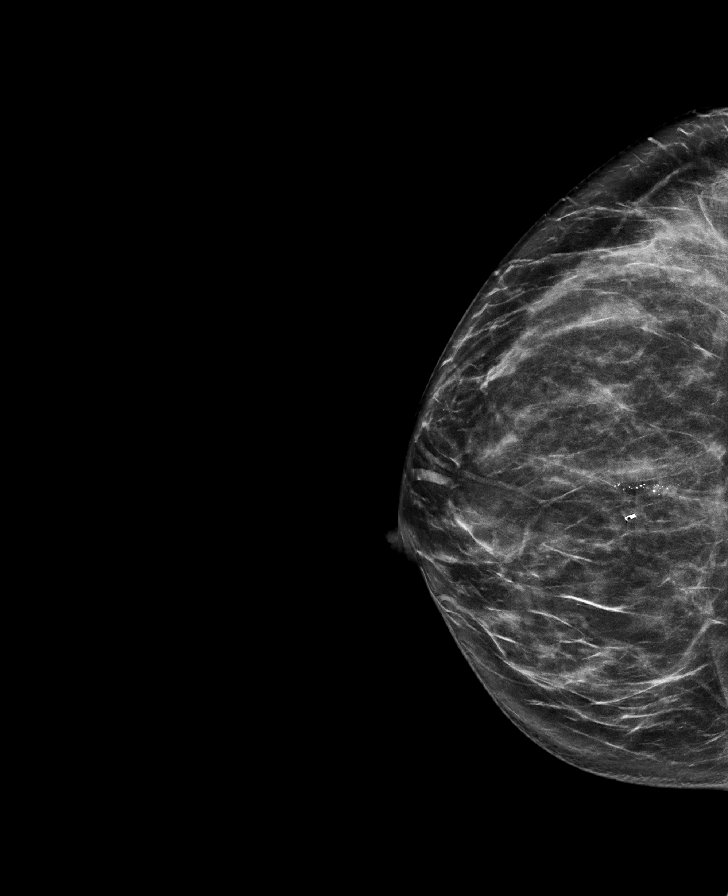

[L CC synth-2D]
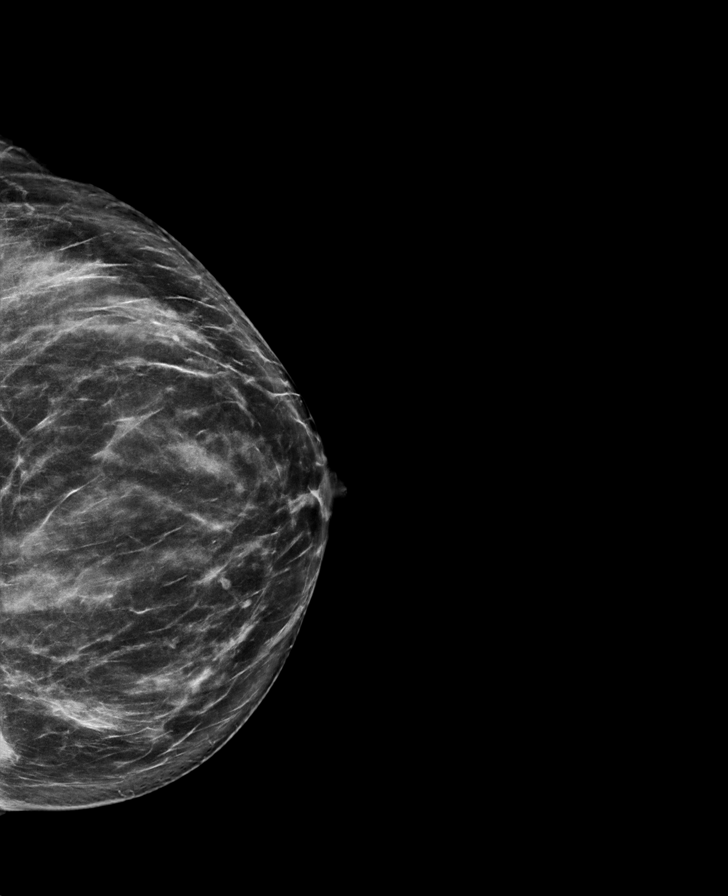

[R CC tomo · tomo slice 40/79.0]
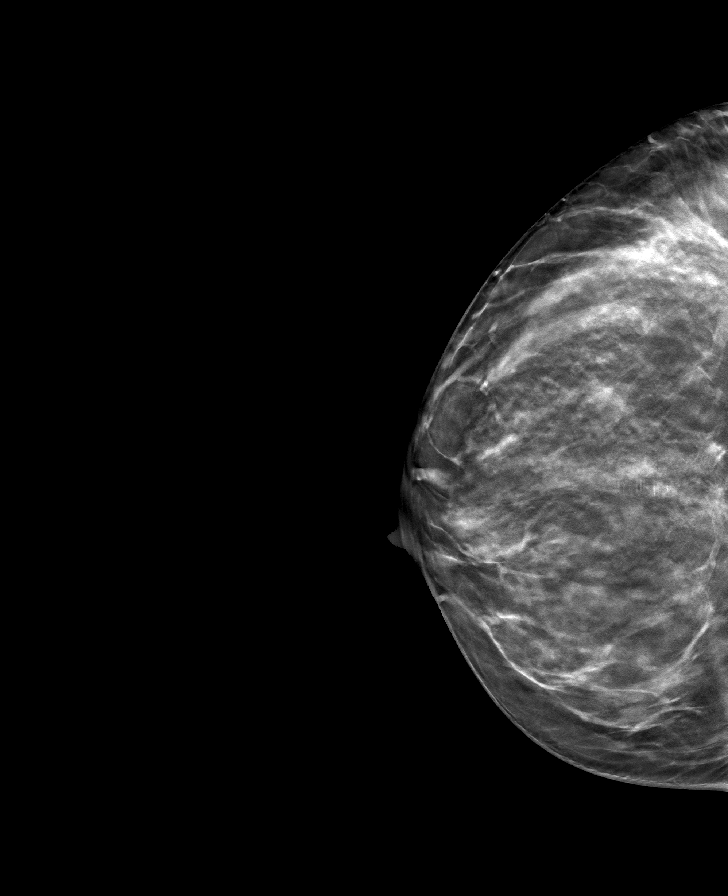

[8 of 27 positions shown; findings below may reference images not displayed]

ACR Breast Density Category c: The breast tissue is heterogeneously
dense, which may obscure small masses.
FINDINGS: Calcifications in the upper right breast have become more apparent
and likely more numerous, mostly small round in configuration,
spanning 1.7 cm, with no associated mass or distortion. The coil
shaped biopsy clip lies just medial to the calcifications.

There are no other breast calcifications, no masses or areas of
architectural distortion and no significant areas of asymmetry. The
previously seen area of asymmetry in the right breast is no longer
evident.
IMPRESSION: 1. Right breast calcifications that are consistent with the biopsy
results of fibrocystic change. However, since they have increased in
conspicuity and likely in number, 1 additional diagnostic follow-up
study is recommended to ensure long-term stability.
2. No other abnormalities.

RECOMMENDATION:
Diagnostic mammography with right breast magnification views in 1
year.

I have discussed the findings and recommendations with the patient.
If applicable, a reminder letter will be sent to the patient
regarding the next appointment.

BI-RADS CATEGORY  3: Probably benign.

## 2021-08-30 ENCOUNTER — Other Ambulatory Visit: Payer: Self-pay | Admitting: Family Medicine

## 2021-08-30 ENCOUNTER — Ambulatory Visit
Admission: RE | Admit: 2021-08-30 | Discharge: 2021-08-30 | Disposition: A | Discharge status: Home / Self Care | Payer: BC Managed Care – PPO | Source: Ambulatory Visit | Attending: Family Medicine | Admitting: Family Medicine

## 2021-08-30 DIAGNOSIS — Z09 Encounter for follow-up examination after completed treatment for conditions other than malignant neoplasm: Secondary | ICD-10-CM

## 2021-08-30 DIAGNOSIS — R921 Mammographic calcification found on diagnostic imaging of breast: Secondary | ICD-10-CM

## 2021-09-08 ENCOUNTER — Ambulatory Visit
Admission: RE | Admit: 2021-09-08 | Discharge: 2021-09-08 | Disposition: A | Discharge status: Home / Self Care | Payer: BC Managed Care – PPO | Source: Ambulatory Visit | Attending: Family Medicine | Admitting: Family Medicine

## 2021-09-08 ENCOUNTER — Other Ambulatory Visit (HOSPITAL_COMMUNITY): Payer: Self-pay | Admitting: Diagnostic Radiology

## 2021-09-08 DIAGNOSIS — R921 Mammographic calcification found on diagnostic imaging of breast: Secondary | ICD-10-CM

## 2021-09-08 DIAGNOSIS — Z09 Encounter for follow-up examination after completed treatment for conditions other than malignant neoplasm: Secondary | ICD-10-CM

## 2021-09-08 DIAGNOSIS — D0511 Intraductal carcinoma in situ of right breast: Secondary | ICD-10-CM | POA: Diagnosis not present

## 2021-09-12 ENCOUNTER — Ambulatory Visit: Payer: Self-pay | Admitting: Surgery

## 2021-09-12 DIAGNOSIS — D0511 Intraductal carcinoma in situ of right breast: Secondary | ICD-10-CM

## 2021-09-12 DIAGNOSIS — C801 Malignant (primary) neoplasm, unspecified: Secondary | ICD-10-CM

## 2021-09-12 HISTORY — DX: Malignant (primary) neoplasm, unspecified: C80.1

## 2021-09-12 NOTE — H&P (View-Only) (Signed)
Chief Complaint: Breast Problem       History of Present Illness: Jasmine Good is a 56 y.o. female who is seen today as an office consultation at the request of Dr. Madilyn Fireman for evaluation of Breast Problem .     This is a 56 year old female in excellent health who presents after having had a right breast biopsy performed in 2021 for right breast calcifications.  Biopsy revealed fibrocystic changes with calcifications.  She has had follow-up diagnostic mammograms.  Recent mammogram revealed a 1.3 cm area of grouped coarse calcifications in the upper right breast.  She underwent stereotactic biopsy of this area.  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  The prognostic panel is pending.  She presents now to discuss surgical options.  No family history of breast cancer.  No previous breast surgery.  No breast complaints prior to this most recent biopsy.   She is accompanied by her sister and her boyfriend.     Review of Systems: A complete review of systems was obtained from the patient.  I have reviewed this information and discussed as appropriate with the patient.  See HPI as well for other ROS.   Review of Systems  Constitutional: Negative.   HENT: Negative.    Eyes: Negative.   Respiratory: Negative.    Cardiovascular: Negative.   Gastrointestinal: Negative.   Genitourinary: Negative.   Musculoskeletal: Negative.   Skin: Negative.   Neurological: Negative.   Endo/Heme/Allergies: Negative.   Psychiatric/Behavioral: Negative.         Medical History: Past Medical History History reviewed. No pertinent past medical history.    Patient Active Problem List Diagnosis  Benign neoplasm of transverse colon  DUB (dysfunctional uterine bleeding)  Iron deficiency anemia  Ductal carcinoma in situ (DCIS) of right breast     Past Surgical History Past Surgical History: Procedure Laterality Date  rejmoval of fibroids          Allergies No Known Allergies    No  current outpatient medications on file prior to visit.   No current facility-administered medications on file prior to visit.     Family History Family History Problem Relation Age of Onset  Diabetes Father        Social History   Tobacco Use Smoking Status Never Smokeless Tobacco Not on file     Social History Social History    Socioeconomic History  Marital status: Single Tobacco Use  Smoking status: Never Substance and Sexual Activity  Alcohol use: Never  Drug use: Never      Objective:     Vitals:   09/12/21 1123 BP: 110/72 Pulse: 90 Temp: 36.7 C (98.1 F) SpO2: 98% Weight: 62 kg (136 lb 9.6 oz) Height: 165.1 cm ('5\' 5"'$ )   Body mass index is 22.73 kg/m.   Physical Exam    Constitutional:  WDWN in NAD, conversant, no obvious deformities; lying in bed comfortably Eyes:  Pupils equal, round; sclera anicteric; moist conjunctiva; no lid lag HENT:  Oral mucosa moist; good dentition  Neck:  No masses palpated, trachea midline; no thyromegaly Lungs:  CTA bilaterally; normal respiratory effort Breasts:  symmetric, no nipple changes; no palpable masses or lymphadenopathy on either side CV:  Regular rate and rhythm; no murmurs; extremities well-perfused with no edema Abd:  +bowel sounds, soft, non-tender, no palpable organomegaly; no palpable hernias Musc:  Normal gait; no apparent clubbing or cyanosis in extremities Lymphatic:  No palpable cervical or axillary lymphadenopathy Skin:  Warm, dry; no  sign of jaundice Psychiatric - alert and oriented x 4; calm mood and affect     Labs, Imaging and Diagnostic Testing: DCIS Grade 2 with focal necrosis   CLINICAL DATA:  Patient for follow-up of right breast calcifications at prior biopsy site.   EXAM: DIGITAL DIAGNOSTIC BILATERAL MAMMOGRAM WITH TOMOSYNTHESIS AND CAD   TECHNIQUE: Bilateral digital diagnostic mammography and breast tomosynthesis was performed. The images were evaluated with  computer-aided detection.   COMPARISON:  Previous exam(s).   ACR Breast Density Category c: The breast tissue is heterogeneously dense, which may obscure small masses.   FINDINGS: Magnification views of the right breast were obtained. Just lateral to the coil shaped marking clip is a 1.3 cm linearly oriented group of coarse heterogeneous calcifications. No additional masses, calcifications or nonsurgical distortion identified within either breast.   IMPRESSION: Indeterminate linearly oriented coarse heterogeneous calcifications just lateral to the biopsy marking clip within the posterior aspect of the right breast.   RECOMMENDATION: Stereotactic guided core needle biopsy of the linearly oriented coarse heterogeneous right breast calcifications.   I have discussed the findings and recommendations with the patient. If applicable, a reminder letter will be sent to the patient regarding the next appointment.   BI-RADS CATEGORY  4: Suspicious.     Electronically Signed   By: Lovey Newcomer M.D.   On: 08/30/2021 12:11 Assessment and Plan: Diagnoses and all orders for this visit:   Ductal carcinoma in situ (DCIS) of right breast -     Ambulatory Referral to Oncology-Medical -     Ambulatory Referral to Radiation Oncology       Right breast radioactive seed localized lumpectomy.  The surgical procedure has been discussed with the patient.  Potential risks, benefits, alternative treatments, and expected outcomes have been explained.  All of the patient's questions at this time have been answered.  The likelihood of reaching the patient's treatment goal is good.  The patient understand the proposed surgical procedure and wishes to proceed.     No follow-ups on file.   Carlean Jews, MD  09/12/2021 12:08 PM

## 2021-09-12 NOTE — H&P (Signed)
Chief Complaint: Breast Problem       History of Present Illness: Jasmine Good is a 56 y.o. female who is seen today as an office consultation at the request of Dr. Madilyn Fireman for evaluation of Breast Problem .     This is a 56 year old female in excellent health who presents after having had a right breast biopsy performed in 2021 for right breast calcifications.  Biopsy revealed fibrocystic changes with calcifications.  She has had follow-up diagnostic mammograms.  Recent mammogram revealed a 1.3 cm area of grouped coarse calcifications in the upper right breast.  She underwent stereotactic biopsy of this area.  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  The prognostic panel is pending.  She presents now to discuss surgical options.  No family history of breast cancer.  No previous breast surgery.  No breast complaints prior to this most recent biopsy.   She is accompanied by her sister and her boyfriend.     Review of Systems: A complete review of systems was obtained from the patient.  I have reviewed this information and discussed as appropriate with the patient.  See HPI as well for other ROS.   Review of Systems  Constitutional: Negative.   HENT: Negative.    Eyes: Negative.   Respiratory: Negative.    Cardiovascular: Negative.   Gastrointestinal: Negative.   Genitourinary: Negative.   Musculoskeletal: Negative.   Skin: Negative.   Neurological: Negative.   Endo/Heme/Allergies: Negative.   Psychiatric/Behavioral: Negative.         Medical History: Past Medical History History reviewed. No pertinent past medical history.    Patient Active Problem List Diagnosis  Benign neoplasm of transverse colon  DUB (dysfunctional uterine bleeding)  Iron deficiency anemia  Ductal carcinoma in situ (DCIS) of right breast     Past Surgical History Past Surgical History: Procedure Laterality Date  rejmoval of fibroids          Allergies No Known Allergies    No  current outpatient medications on file prior to visit.   No current facility-administered medications on file prior to visit.     Family History Family History Problem Relation Age of Onset  Diabetes Father        Social History   Tobacco Use Smoking Status Never Smokeless Tobacco Not on file     Social History Social History    Socioeconomic History  Marital status: Single Tobacco Use  Smoking status: Never Substance and Sexual Activity  Alcohol use: Never  Drug use: Never      Objective:     Vitals:   09/12/21 1123 BP: 110/72 Pulse: 90 Temp: 36.7 C (98.1 F) SpO2: 98% Weight: 62 kg (136 lb 9.6 oz) Height: 165.1 cm ('5\' 5"'$ )   Body mass index is 22.73 kg/m.   Physical Exam    Constitutional:  WDWN in NAD, conversant, no obvious deformities; lying in bed comfortably Eyes:  Pupils equal, round; sclera anicteric; moist conjunctiva; no lid lag HENT:  Oral mucosa moist; good dentition  Neck:  No masses palpated, trachea midline; no thyromegaly Lungs:  CTA bilaterally; normal respiratory effort Breasts:  symmetric, no nipple changes; no palpable masses or lymphadenopathy on either side CV:  Regular rate and rhythm; no murmurs; extremities well-perfused with no edema Abd:  +bowel sounds, soft, non-tender, no palpable organomegaly; no palpable hernias Musc:  Normal gait; no apparent clubbing or cyanosis in extremities Lymphatic:  No palpable cervical or axillary lymphadenopathy Skin:  Warm, dry; no  sign of jaundice Psychiatric - alert and oriented x 4; calm mood and affect     Labs, Imaging and Diagnostic Testing: DCIS Grade 2 with focal necrosis   CLINICAL DATA:  Patient for follow-up of right breast calcifications at prior biopsy site.   EXAM: DIGITAL DIAGNOSTIC BILATERAL MAMMOGRAM WITH TOMOSYNTHESIS AND CAD   TECHNIQUE: Bilateral digital diagnostic mammography and breast tomosynthesis was performed. The images were evaluated with  computer-aided detection.   COMPARISON:  Previous exam(s).   ACR Breast Density Category c: The breast tissue is heterogeneously dense, which may obscure small masses.   FINDINGS: Magnification views of the right breast were obtained. Just lateral to the coil shaped marking clip is a 1.3 cm linearly oriented group of coarse heterogeneous calcifications. No additional masses, calcifications or nonsurgical distortion identified within either breast.   IMPRESSION: Indeterminate linearly oriented coarse heterogeneous calcifications just lateral to the biopsy marking clip within the posterior aspect of the right breast.   RECOMMENDATION: Stereotactic guided core needle biopsy of the linearly oriented coarse heterogeneous right breast calcifications.   I have discussed the findings and recommendations with the patient. If applicable, a reminder letter will be sent to the patient regarding the next appointment.   BI-RADS CATEGORY  4: Suspicious.     Electronically Signed   By: Lovey Newcomer M.D.   On: 08/30/2021 12:11 Assessment and Plan: Diagnoses and all orders for this visit:   Ductal carcinoma in situ (DCIS) of right breast -     Ambulatory Referral to Oncology-Medical -     Ambulatory Referral to Radiation Oncology       Right breast radioactive seed localized lumpectomy.  The surgical procedure has been discussed with the patient.  Potential risks, benefits, alternative treatments, and expected outcomes have been explained.  All of the patient's questions at this time have been answered.  The likelihood of reaching the patient's treatment goal is good.  The patient understand the proposed surgical procedure and wishes to proceed.     No follow-ups on file.   Carlean Jews, MD  09/12/2021 12:08 PM

## 2021-09-13 ENCOUNTER — Encounter (HOSPITAL_BASED_OUTPATIENT_CLINIC_OR_DEPARTMENT_OTHER): Payer: Self-pay | Admitting: Surgery

## 2021-09-13 ENCOUNTER — Other Ambulatory Visit: Payer: Self-pay | Admitting: Surgery

## 2021-09-13 ENCOUNTER — Telehealth: Payer: Self-pay | Admitting: Family Medicine

## 2021-09-13 ENCOUNTER — Telehealth: Payer: Self-pay | Admitting: Radiation Oncology

## 2021-09-13 DIAGNOSIS — D0511 Intraductal carcinoma in situ of right breast: Secondary | ICD-10-CM

## 2021-09-13 NOTE — Telephone Encounter (Signed)
Called patient to schedule a consultation w. Dr. Moody. No answer, LVM for a return call.  

## 2021-09-13 NOTE — Telephone Encounter (Signed)
Called and spoke with Jasmine Good bout her new dx.  She was able to meet with Dr. Georgette Dover yesterday and had a lot of her questions answered.  Need scheduled her next Tuesday for surgical removal of the tumor and then she will meet with the oncologist for radiation afterwards.  She feels mentally in a much better place now that she has a plan and more information.  Courage her to reach out if at any point we can be helpful. She has already stopped her birth control.  Removed medication from her list.

## 2021-09-14 ENCOUNTER — Other Ambulatory Visit: Payer: Self-pay

## 2021-09-14 ENCOUNTER — Telehealth: Payer: Self-pay | Admitting: Hematology and Oncology

## 2021-09-14 ENCOUNTER — Encounter (HOSPITAL_BASED_OUTPATIENT_CLINIC_OR_DEPARTMENT_OTHER): Payer: Self-pay | Admitting: Surgery

## 2021-09-14 ENCOUNTER — Telehealth: Payer: Self-pay | Admitting: Radiation Oncology

## 2021-09-14 NOTE — Telephone Encounter (Signed)
Scheduled appt per 8/8 staff msg. Pt is aware of appt date and time. Pt is aware to arrive 15 mins prior to appt time and to bring and updated insurance card. Pt is aware of appt location.

## 2021-09-14 NOTE — Telephone Encounter (Signed)
Called patient to schedule a consultation w. Dr. Moody. No answer, LVM for a return call.  

## 2021-09-15 ENCOUNTER — Telehealth: Payer: Self-pay | Admitting: Radiation Oncology

## 2021-09-15 NOTE — Telephone Encounter (Signed)
Called patient to schedule a consultation w. Dr. Moody. No answer, LVM for a return call.  

## 2021-09-15 NOTE — Telephone Encounter (Signed)
Sent unable to contact letter 8/10.

## 2021-09-16 ENCOUNTER — Ambulatory Visit
Admission: RE | Admit: 2021-09-16 | Discharge: 2021-09-16 | Disposition: A | Discharge status: Home / Self Care | Payer: BC Managed Care – PPO | Source: Ambulatory Visit | Attending: Surgery | Admitting: Surgery

## 2021-09-16 DIAGNOSIS — D0511 Intraductal carcinoma in situ of right breast: Secondary | ICD-10-CM

## 2021-09-16 DIAGNOSIS — C50911 Malignant neoplasm of unspecified site of right female breast: Secondary | ICD-10-CM | POA: Diagnosis not present

## 2021-09-16 NOTE — Progress Notes (Signed)
Patient was provided with CHG cleanser to use at home before the procedure. Patient verbalized understanding of instructions.       Patient Instructions  The night before surgery:  No food after midnight. ONLY clear liquids after midnight  The day of surgery (if you do NOT have diabetes):  Drink ONE (1) Pre-Surgery Clear Ensure as directed.   This drink was given to you during your hospital  pre-op appointment visit. The pre-op nurse will instruct you on the time to drink the  Pre-Surgery Ensure depending on your surgery time. Finish the drink at the designated time by the pre-op nurse.  Nothing else to drink after completing the  Pre-Surgery Clear Ensure.  The day of surgery (if you have diabetes): Drink ONE (1) Gatorade 2 (G2) as directed. This drink was given to you during your hospital  pre-op appointment visit.  The pre-op nurse will instruct you on the time to drink the   Gatorade 2 (G2) depending on your surgery time. Color of the Gatorade may vary. Red is not allowed. Nothing else to drink after completing the  Gatorade 2 (G2).         If you have questions, please contact your surgeon's office.

## 2021-09-20 ENCOUNTER — Ambulatory Visit (HOSPITAL_BASED_OUTPATIENT_CLINIC_OR_DEPARTMENT_OTHER): Payer: BC Managed Care – PPO | Admitting: Anesthesiology

## 2021-09-20 ENCOUNTER — Other Ambulatory Visit: Payer: Self-pay

## 2021-09-20 ENCOUNTER — Ambulatory Visit (HOSPITAL_BASED_OUTPATIENT_CLINIC_OR_DEPARTMENT_OTHER)
Admission: RE | Admit: 2021-09-20 | Discharge: 2021-09-20 | Disposition: A | Discharge status: Home / Self Care | Payer: BC Managed Care – PPO | Attending: Surgery | Admitting: Surgery

## 2021-09-20 ENCOUNTER — Encounter (HOSPITAL_BASED_OUTPATIENT_CLINIC_OR_DEPARTMENT_OTHER): Payer: Self-pay | Admitting: Surgery

## 2021-09-20 ENCOUNTER — Ambulatory Visit
Admission: RE | Admit: 2021-09-20 | Discharge: 2021-09-20 | Disposition: A | Discharge status: Home / Self Care | Payer: BC Managed Care – PPO | Source: Ambulatory Visit | Attending: Surgery | Admitting: Surgery

## 2021-09-20 ENCOUNTER — Encounter (HOSPITAL_BASED_OUTPATIENT_CLINIC_OR_DEPARTMENT_OTHER)
Admission: RE | Disposition: A | Discharge status: Home / Self Care | Payer: Self-pay | Source: Home / Self Care | Attending: Surgery

## 2021-09-20 DIAGNOSIS — N6031 Fibrosclerosis of right breast: Secondary | ICD-10-CM | POA: Diagnosis not present

## 2021-09-20 DIAGNOSIS — D0511 Intraductal carcinoma in situ of right breast: Secondary | ICD-10-CM | POA: Insufficient documentation

## 2021-09-20 DIAGNOSIS — Z01818 Encounter for other preprocedural examination: Secondary | ICD-10-CM

## 2021-09-20 DIAGNOSIS — Z170 Estrogen receptor positive status [ER+]: Secondary | ICD-10-CM | POA: Diagnosis not present

## 2021-09-20 DIAGNOSIS — C50911 Malignant neoplasm of unspecified site of right female breast: Secondary | ICD-10-CM | POA: Diagnosis not present

## 2021-09-20 DIAGNOSIS — R921 Mammographic calcification found on diagnostic imaging of breast: Secondary | ICD-10-CM | POA: Diagnosis not present

## 2021-09-20 DIAGNOSIS — C50919 Malignant neoplasm of unspecified site of unspecified female breast: Secondary | ICD-10-CM

## 2021-09-20 HISTORY — DX: Anemia, unspecified: D64.9

## 2021-09-20 HISTORY — DX: Malignant neoplasm of unspecified site of unspecified female breast: C50.919

## 2021-09-20 HISTORY — PX: BREAST LUMPECTOMY WITH RADIOACTIVE SEED LOCALIZATION: SHX6424

## 2021-09-20 LAB — POCT PREGNANCY, URINE: Preg Test, Ur: NEGATIVE

## 2021-09-20 SURGERY — BREAST LUMPECTOMY WITH RADIOACTIVE SEED LOCALIZATION
Anesthesia: General | Site: Breast | Laterality: Right

## 2021-09-20 MED ORDER — MIDAZOLAM HCL 5 MG/5ML IJ SOLN
INTRAMUSCULAR | Status: DC | PRN
  Administered 2021-09-20: .5 mg via INTRAVENOUS
  Administered 2021-09-20: 1 mg via INTRAVENOUS
  Administered 2021-09-20: .5 mg via INTRAVENOUS

## 2021-09-20 MED ORDER — ONDANSETRON HCL 4 MG/2ML IJ SOLN: 4.0000 mg | Freq: Once | INTRAMUSCULAR | Status: DC | PRN

## 2021-09-20 MED ORDER — DEXAMETHASONE SODIUM PHOSPHATE 10 MG/ML IJ SOLN
INTRAMUSCULAR | Status: DC | PRN
  Administered 2021-09-20: 8 mg via INTRAVENOUS

## 2021-09-20 MED ORDER — ACETAMINOPHEN 500 MG PO TABS
ORAL_TABLET | ORAL | Status: AC
  Filled 2021-09-20: qty 2

## 2021-09-20 MED ORDER — METOCLOPRAMIDE HCL 5 MG/ML IJ SOLN
INTRAMUSCULAR | Status: DC | PRN
  Administered 2021-09-20: 10 mg via INTRAVENOUS

## 2021-09-20 MED ORDER — PROPOFOL 10 MG/ML IV BOLUS
INTRAVENOUS | Status: DC | PRN
  Administered 2021-09-20: 150 mg via INTRAVENOUS

## 2021-09-20 MED ORDER — MIDAZOLAM HCL 2 MG/2ML IJ SOLN
INTRAMUSCULAR | Status: AC
  Filled 2021-09-20: qty 2

## 2021-09-20 MED ORDER — OXYCODONE HCL 5 MG PO TABS: 5.0000 mg | ORAL_TABLET | Freq: Once | ORAL | Status: DC | PRN

## 2021-09-20 MED ORDER — ACETAMINOPHEN 500 MG PO TABS
1000.0000 mg | ORAL_TABLET | ORAL | Status: AC
  Administered 2021-09-20: 1000 mg via ORAL

## 2021-09-20 MED ORDER — CHLORHEXIDINE GLUCONATE CLOTH 2 % EX PADS: 6.0000 | MEDICATED_PAD | Freq: Once | CUTANEOUS | Status: DC

## 2021-09-20 MED ORDER — FENTANYL CITRATE (PF) 100 MCG/2ML IJ SOLN
INTRAMUSCULAR | Status: AC
  Filled 2021-09-20: qty 2

## 2021-09-20 MED ORDER — FENTANYL CITRATE (PF) 100 MCG/2ML IJ SOLN
INTRAMUSCULAR | Status: DC | PRN
  Administered 2021-09-20: 50 ug via INTRAVENOUS

## 2021-09-20 MED ORDER — CEFAZOLIN SODIUM-DEXTROSE 2-4 GM/100ML-% IV SOLN
INTRAVENOUS | Status: AC
  Filled 2021-09-20: qty 100

## 2021-09-20 MED ORDER — CEFAZOLIN SODIUM-DEXTROSE 2-4 GM/100ML-% IV SOLN
2.0000 g | INTRAVENOUS | Status: AC
  Administered 2021-09-20: 2 g via INTRAVENOUS

## 2021-09-20 MED ORDER — LACTATED RINGERS IV SOLN: INTRAVENOUS | Status: DC

## 2021-09-20 MED ORDER — OXYCODONE HCL 5 MG/5ML PO SOLN: 5.0000 mg | Freq: Once | ORAL | Status: DC | PRN

## 2021-09-20 MED ORDER — BUPIVACAINE-EPINEPHRINE 0.25% -1:200000 IJ SOLN
INTRAMUSCULAR | Status: DC | PRN
  Administered 2021-09-20: 9 mL

## 2021-09-20 MED ORDER — FENTANYL CITRATE (PF) 100 MCG/2ML IJ SOLN: 25.0000 ug | INTRAMUSCULAR | Status: DC | PRN

## 2021-09-20 MED ORDER — PROPOFOL 10 MG/ML IV BOLUS
INTRAVENOUS | Status: AC
  Filled 2021-09-20: qty 20

## 2021-09-20 MED ORDER — DEXMEDETOMIDINE HCL IN NACL 80 MCG/20ML IV SOLN
INTRAVENOUS | Status: AC
  Filled 2021-09-20: qty 20

## 2021-09-20 MED ORDER — LIDOCAINE HCL 1 % IJ SOLN
INTRAMUSCULAR | Status: DC | PRN
  Administered 2021-09-20: 25 mg via INTRADERMAL
  Administered 2021-09-20: 50 mg via INTRADERMAL

## 2021-09-20 MED ORDER — ONDANSETRON HCL 4 MG/2ML IJ SOLN
INTRAMUSCULAR | Status: DC | PRN
  Administered 2021-09-20: 4 mg via INTRAVENOUS

## 2021-09-20 MED ORDER — LIDOCAINE 2% (20 MG/ML) 5 ML SYRINGE
INTRAMUSCULAR | Status: AC
  Filled 2021-09-20: qty 5

## 2021-09-20 SURGICAL SUPPLY — 50 items
APL PRP STRL LF DISP 70% ISPRP (MISCELLANEOUS) ×1
APL SKNCLS STERI-STRIP NONHPOA (GAUZE/BANDAGES/DRESSINGS) ×1
APPLIER CLIP 9.375 MED OPEN (MISCELLANEOUS) ×2
APR CLP MED 9.3 20 MLT OPN (MISCELLANEOUS) ×1
BENZOIN TINCTURE PRP APPL 2/3 (GAUZE/BANDAGES/DRESSINGS) ×2 IMPLANT
BLADE HEX COATED 2.75 (ELECTRODE) ×2 IMPLANT
BLADE SURG 15 STRL LF DISP TIS (BLADE) ×1 IMPLANT
BLADE SURG 15 STRL SS (BLADE) ×2
CANISTER SUC SOCK COL 7IN (MISCELLANEOUS) IMPLANT
CANISTER SUCT 1200ML W/VALVE (MISCELLANEOUS) ×1 IMPLANT
CHLORAPREP W/TINT 26 (MISCELLANEOUS) ×2 IMPLANT
CLIP APPLIE 9.375 MED OPEN (MISCELLANEOUS) ×1 IMPLANT
COVER BACK TABLE 60X90IN (DRAPES) ×2 IMPLANT
COVER MAYO STAND STRL (DRAPES) ×2 IMPLANT
COVER PROBE W GEL 5X96 (DRAPES) ×2 IMPLANT
DRAPE LAPAROTOMY 100X72 PEDS (DRAPES) ×2 IMPLANT
DRAPE UTILITY XL STRL (DRAPES) ×2 IMPLANT
DRSG TEGADERM 4X4.75 (GAUZE/BANDAGES/DRESSINGS) ×2 IMPLANT
ELECT REM PT RETURN 9FT ADLT (ELECTROSURGICAL) ×2
ELECTRODE REM PT RTRN 9FT ADLT (ELECTROSURGICAL) ×1 IMPLANT
GAUZE SPONGE 4X4 12PLY STRL LF (GAUZE/BANDAGES/DRESSINGS) IMPLANT
GLOVE BIO SURGEON STRL SZ7 (GLOVE) ×2 IMPLANT
GLOVE BIOGEL PI IND STRL 7.5 (GLOVE) ×1 IMPLANT
GLOVE BIOGEL PI INDICATOR 7.5 (GLOVE) ×1
GOWN STRL REUS W/ TWL LRG LVL3 (GOWN DISPOSABLE) ×2 IMPLANT
GOWN STRL REUS W/TWL LRG LVL3 (GOWN DISPOSABLE) ×4
ILLUMINATOR WAVEGUIDE N/F (MISCELLANEOUS) IMPLANT
KIT MARKER MARGIN INK (KITS) ×2 IMPLANT
LIGHT WAVEGUIDE WIDE FLAT (MISCELLANEOUS) IMPLANT
NDL HYPO 25X1 1.5 SAFETY (NEEDLE) ×1 IMPLANT
NEEDLE HYPO 25X1 1.5 SAFETY (NEEDLE) ×2 IMPLANT
NS IRRIG 1000ML POUR BTL (IV SOLUTION) ×2 IMPLANT
PACK BASIN DAY SURGERY FS (CUSTOM PROCEDURE TRAY) ×2 IMPLANT
PENCIL SMOKE EVACUATOR (MISCELLANEOUS) ×2 IMPLANT
SLEEVE SCD COMPRESS KNEE MED (STOCKING) ×2 IMPLANT
SPIKE FLUID TRANSFER (MISCELLANEOUS) IMPLANT
SPONGE GAUZE 2X2 8PLY STRL LF (GAUZE/BANDAGES/DRESSINGS) IMPLANT
SPONGE T-LAP 18X18 ~~LOC~~+RFID (SPONGE) IMPLANT
SPONGE T-LAP 4X18 ~~LOC~~+RFID (SPONGE) ×2 IMPLANT
STRIP CLOSURE SKIN 1/2X4 (GAUZE/BANDAGES/DRESSINGS) ×2 IMPLANT
SUT MON AB 4-0 PC3 18 (SUTURE) ×2 IMPLANT
SUT SILK 2 0 SH (SUTURE) IMPLANT
SUT VIC AB 3-0 SH 27 (SUTURE) ×2
SUT VIC AB 3-0 SH 27X BRD (SUTURE) ×1 IMPLANT
SYR BULB EAR ULCER 3OZ GRN STR (SYRINGE) ×1 IMPLANT
SYR CONTROL 10ML LL (SYRINGE) ×2 IMPLANT
TOWEL GREEN STERILE FF (TOWEL DISPOSABLE) ×2 IMPLANT
TRAY FAXITRON CT DISP (TRAY / TRAY PROCEDURE) ×2 IMPLANT
TUBE CONNECTING 20X1/4 (TUBING) ×1 IMPLANT
YANKAUER SUCT BULB TIP NO VENT (SUCTIONS) ×1 IMPLANT

## 2021-09-20 NOTE — Anesthesia Postprocedure Evaluation (Signed)
Anesthesia Post Note  Patient: Jasmine Good  Procedure(s) Performed: RIGHT BREAST LUMPECTOMY WITH RADIOACTIVE SEED LOCALIZATION (Right: Breast)     Patient location during evaluation: PACU Anesthesia Type: General Level of consciousness: awake and alert and oriented Pain management: pain level controlled Vital Signs Assessment: post-procedure vital signs reviewed and stable Respiratory status: spontaneous breathing, nonlabored ventilation and respiratory function stable Cardiovascular status: blood pressure returned to baseline and stable Postop Assessment: no apparent nausea or vomiting Anesthetic complications: no   No notable events documented.  Last Vitals:  Vitals:   09/20/21 1030 09/20/21 1040  BP: 115/74 113/69  Pulse: 62 71  Resp: 17 18  Temp:  (!) 36.3 C  SpO2: 97% 97%    Last Pain:  Vitals:   09/20/21 1040  TempSrc: Oral  PainSc: 0-No pain                 Tarisha Fader A.

## 2021-09-20 NOTE — Op Note (Signed)
Pre-op Diagnosis:  Right breast ductal carcinoma in situ Post-op Diagnosis: same Procedure:  Right radioactive seed localized lumpectomy Surgeon:  Alyxandria Wentz K. Anesthesia:  GEN - LMA Indications:  This is a 56 year old female in excellent health who presents after having had a right breast biopsy performed in 2021 for right breast calcifications.  Biopsy revealed fibrocystic changes with calcifications.  She has had follow-up diagnostic mammograms.  Recent mammogram revealed a 1.3 cm area of grouped coarse calcifications in the upper right breast.  She underwent stereotactic biopsy of this area.  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  The prognostic panel is pending.  She presents now to discuss surgical options.  No family history of breast cancer.  No previous breast surgery.  No breast complaints prior to this most recent biopsy.  Description of procedure: The patient is brought to the operating room placed in supine position on the operating room table. After an adequate level of general anesthesia was obtained, her right breast was prepped with ChloraPrep and draped in sterile fashion. A timeout was taken to ensure the proper patient and proper procedure. We interrogated the breast with the neoprobe. We made a transverseincision in the upper central breast after infiltrating with 0.25% Marcaine. Dissection was carried down in the breast tissue with cautery. We used the neoprobe to guide [place] towards the radioactive seed. We excised an area of tissue around the radioactive seed 2 cm in diameter. The specimen was removed and was oriented with a paint kit. Specimen mammogram showed the radioactive seed as well as the biopsy clip within the specimen. This was sent for pathologic examination. There is no residual radioactivity within the biopsy cavity. We inspected carefully for hemostasis. The wound was thoroughly irrigated. Clips were placed in all four margins, as well as the deep margin.  The  wound was closed with a deep layer of 3-0 Vicryl and a subcuticular layer of 4-0 Monocryl. Benzoin and Steri-Strips were applied. The patient was then extubated and brought to the recovery room in stable condition. All sponge, instrument, and needle counts are correct.  Imogene Burn. Georgette Dover, MD, [facility]  General/ Trauma Surgery  09/20/2021 10:05 AM

## 2021-09-20 NOTE — Discharge Instructions (Addendum)
[place] Office Phone Number [phone]  BREAST BIOPSY/ PARTIAL MASTECTOMY: POST OP INSTRUCTIONS  Always review your discharge instruction sheet given to you by the facility where your surgery was performed.  IF YOU HAVE DISABILITY OR FAMILY LEAVE FORMS, YOU MUST BRING THEM TO THE OFFICE FOR PROCESSING.  DO NOT GIVE THEM TO YOUR DOCTOR.  A prescription for pain medication may be given to you upon discharge.  Take your pain medication as prescribed, if needed.  If narcotic pain medicine is not needed, then you may take acetaminophen (Tylenol) or ibuprofen (Advil) as needed. Take your usually prescribed medications unless otherwise directed If you need a refill on your pain medication, please contact your pharmacy.  They will contact our office to request authorization.  Prescriptions will not be filled after 5pm or on week-ends. You should eat very light the first 24 hours after surgery, such as soup, crackers, pudding, etc.  Resume your normal diet the day after surgery. Most patients will experience some swelling and bruising in the breast.  Ice packs and a good support bra will help.  Swelling and bruising can take several days to resolve.  It is common to experience some constipation if taking pain medication after surgery.  Increasing fluid intake and taking a stool softener will usually help or prevent this problem from occurring.  A mild laxative (Milk of Magnesia or Miralax) should be taken according to package directions if there are no bowel movements after 48 hours. Unless discharge instructions indicate otherwise, you may remove your bandages 24-48 hours after surgery, and you may shower at that time.  You may have steri-strips (small skin tapes) in place directly over the incision.  These strips should be left on the skin for 7-10 days.  If your surgeon used skin glue on the incision, you may shower in 24 hours.  The glue will flake off over the next 2-3 weeks.  Any  sutures or staples will be removed at the office during your follow-up visit. ACTIVITIES:  You may resume regular daily activities (gradually increasing) beginning the next day.  Wearing a good support bra or sports bra minimizes pain and swelling.  You may have sexual intercourse when it is comfortable. You may drive when you no longer are taking prescription pain medication, you can comfortably wear a seatbelt, and you can safely maneuver your car and apply brakes. RETURN TO WORK:  ______________________________________________________________________________________ Dennis Bast should see your doctor in the office for a follow-up appointment approximately two weeks after your surgery.  Your doctor's nurse will typically make your follow-up appointment when she calls you with your pathology report.  Expect your pathology report 2-3 business days after your surgery.  You may call to check if you do not hear from [place] after three days. OTHER INSTRUCTIONS: _______________________________________________________________________________________________ _____________________________________________________________________________________________________________________________________ _____________________________________________________________________________________________________________________________________ _____________________________________________________________________________________________________________________________________  WHEN TO CALL YOUR DOCTOR: Fever over 101.0 Nausea and/or vomiting. Extreme swelling or bruising. Continued bleeding from incision. Increased pain, redness, or drainage from the incision.  The clinic staff is available to answer your questions during regular business hours.  Please don't hesitate to call and ask to speak to one of the nurses for clinical concerns.  If you have a medical emergency, go to the nearest emergency room or call 911.  A surgeon from [facility] is always on call at the hospital.  For further questions, please visit [email or web address]   No Tylenol until after 1:30pm today if needed  Post Anesthesia Home Care Instructions  Activity:  Get plenty of rest for the remainder of the day. A responsible individual must stay with you for 24 hours following the procedure.  For the next 24 hours, DO NOT: -Drive a car -Paediatric nurse -Drink alcoholic beverages -Take any medication unless instructed by your physician -Make any legal decisions or sign important papers.  Meals: Start with liquid foods such as gelatin or soup. Progress to regular foods as tolerated. Avoid greasy, spicy, heavy foods. If nausea and/or vomiting occur, drink only clear liquids until the nausea and/or vomiting subsides. Call your physician if vomiting continues.  Special Instructions/Symptoms: Your throat may feel dry or sore from the anesthesia or the breathing tube placed in your throat during surgery. If this causes discomfort, gargle with warm salt water. The discomfort should disappear within 24 hours.  If you had a scopolamine patch placed behind your ear for the management of post- operative nausea and/or vomiting:  1. The medication in the patch is effective for 72 hours, after which it should be removed.  Wrap patch in a tissue and discard in the trash. Wash hands thoroughly with soap and water. 2. You may remove the patch earlier than 72 hours if you experience unpleasant side effects which may include dry mouth, dizziness or visual disturbances. 3. Avoid touching the patch. Wash your hands with soap and water after contact with the patch.

## 2021-09-20 NOTE — Interval H&P Note (Signed)
History and Physical Interval Note:  09/20/2021 7:21 AM  Jasmine Good  has presented today for surgery, with the diagnosis of RIGHT BREAST DCIS.  The various methods of treatment have been discussed with the patient and family. After consideration of risks, benefits and other options for treatment, the patient has consented to  Procedure(s): RIGHT BREAST LUMPECTOMY WITH RADIOACTIVE SEED LOCALIZATION (Right) as a surgical intervention.  The patient's history has been reviewed, patient examined, no change in status, stable for surgery.  I have reviewed the patient's chart and labs.  Questions were answered to the patient's satisfaction.     Maia Petties

## 2021-09-20 NOTE — Transfer of Care (Signed)
Immediate Anesthesia Transfer of Care Note  Patient: Jasmine Good  Procedure(s) Performed: RIGHT BREAST LUMPECTOMY WITH RADIOACTIVE SEED LOCALIZATION (Right: Breast)  Patient Location: PACU  Anesthesia Type:General  Level of Consciousness: oriented, drowsy and patient cooperative  Airway & Oxygen Therapy: Patient Spontanous Breathing and Patient connected to face mask oxygen  Post-op Assessment: Report given to RN and Post -op Vital signs reviewed and stable  Post vital signs: Reviewed and stable  Last Vitals:  Vitals Value Taken Time  BP 107/90 09/20/21 1012  Temp    Pulse 71 09/20/21 1013  Resp 15 09/20/21 1013  SpO2 100 % 09/20/21 1013  Vitals shown include unvalidated device data.  Last Pain:  Vitals:   09/20/21 0727  TempSrc: Oral  PainSc: 0-No pain      Patients Stated Pain Goal: 7 (02/54/27 0623)  Complications: No notable events documented.

## 2021-09-20 NOTE — Anesthesia Procedure Notes (Signed)
Procedure Name: LMA Insertion Date/Time: 09/20/2021 9:17 AM  Performed by: Garrel Ridgel, CRNAPre-anesthesia Checklist: Patient identified, Emergency Drugs available, Suction available and Patient being monitored Patient Re-evaluated:Patient Re-evaluated prior to induction Oxygen Delivery Method: Circle system utilized Preoxygenation: Pre-oxygenation with 100% oxygen Induction Type: IV induction Ventilation: Mask ventilation without difficulty LMA: LMA inserted LMA Size: 4.0 Tube type: Oral Number of attempts: 1 Placement Confirmation: positive ETCO2 and breath sounds checked- equal and bilateral Tube secured with: Tape Dental Injury: Teeth and Oropharynx as per pre-operative assessment

## 2021-09-20 NOTE — Anesthesia Preprocedure Evaluation (Signed)
Anesthesia Evaluation  Patient identified by MRN, date of birth, ID band Patient awake    Reviewed: Allergy & Precautions, NPO status , Patient's Chart, lab work & pertinent test results  Airway Mallampati: II  TM Distance: >3 FB Neck ROM: Full    Dental no notable dental hx. (+) Teeth Intact   Pulmonary neg pulmonary ROS,    Pulmonary exam normal breath sounds clear to auscultation       Cardiovascular negative cardio ROS Normal cardiovascular exam Rhythm:Regular Rate:Normal     Neuro/Psych negative neurological ROS  negative psych ROS   GI/Hepatic negative GI ROS, Neg liver ROS,   Endo/Other  Right Breast Ca  Renal/GU negative Renal ROS  negative genitourinary   Musculoskeletal negative musculoskeletal ROS (+)   Abdominal   Peds  Hematology  (+) Blood dyscrasia, anemia ,   Anesthesia Other Findings   Reproductive/Obstetrics                             Anesthesia Physical Anesthesia Plan  ASA: 1  Anesthesia Plan: General   Post-op Pain Management: Minimal or no pain anticipated   Induction: Intravenous  PONV Risk Score and Plan: 4 or greater and Treatment may vary due to age or medical condition, Midazolam, Ondansetron and Dexamethasone  Airway Management Planned: LMA  Additional Equipment: None  Intra-op Plan:   Post-operative Plan: Extubation in OR  Informed Consent: I have reviewed the patients History and Physical, chart, labs and discussed the procedure including the risks, benefits and alternatives for the proposed anesthesia with the patient or authorized representative who has indicated his/her understanding and acceptance.     Dental advisory given  Plan Discussed with: CRNA and Anesthesiologist  Anesthesia Plan Comments:         Anesthesia Quick Evaluation

## 2021-09-21 ENCOUNTER — Encounter (HOSPITAL_BASED_OUTPATIENT_CLINIC_OR_DEPARTMENT_OTHER): Payer: Self-pay | Admitting: Surgery

## 2021-09-21 LAB — SURGICAL PATHOLOGY

## 2021-09-22 ENCOUNTER — Other Ambulatory Visit: Payer: Self-pay

## 2021-09-22 DIAGNOSIS — D0511 Intraductal carcinoma in situ of right breast: Secondary | ICD-10-CM | POA: Insufficient documentation

## 2021-09-22 NOTE — Progress Notes (Signed)
Radiation Oncology         [phone] ________________________________  Name: Jasmine Good        MRN: [id number]  Date of Service: 09/27/2021 DOB: 1965/07/02  [id number], Rene Kocher, MD  Donnie Mesa, MD     REFERRING PHYSICIAN: Donnie Mesa, MD   DIAGNOSIS: The encounter diagnosis was Ductal carcinoma in situ (DCIS) of right breast.   HISTORY OF PRESENT ILLNESS: Jasmine Good is a 56 y.o. female seen at the request of Dr. Georgette Dover for a diagnosis of right breast cancer. She's been followed with diagnostic imaging due to right breast calcifications. She had recent imaging in July 2023 that showed a 1.3 cm linear area of calcifications in the lateral breast. This was adjacent to a prior biopsy clip. She had a stereotactic biopsy on 09/08/21 that showed an intermediate grade DCIS with calcifications and focal necrosis that was ER/PR positive. She underwent a right lumpectomy on 09/20/21 that showed a 1.5 area of intermediate grade DCIS. Her margins were negative, and she's seen to discuss adjuvant therapy.     PREVIOUS RADIATION THERAPY: No   PAST MEDICAL HISTORY:  Past Medical History:  Diagnosis Date   Anemia    Breast cancer ([place]) 09/20/2021   Cancer ([place]) 09/12/2021   DCIS R Breast   Hemoglobin low    Uterine fibroid        PAST SURGICAL HISTORY: Past Surgical History:  Procedure Laterality Date   BREAST LUMPECTOMY WITH RADIOACTIVE SEED LOCALIZATION Right 09/20/2021   Procedure: RIGHT BREAST LUMPECTOMY WITH RADIOACTIVE SEED LOCALIZATION;  Surgeon: Donnie Mesa, MD;  Location: [facility];  Service: General;  Laterality: Right;   COLONOSCOPY WITH PROPOFOL N/A 06/11/2017   Procedure: COLONOSCOPY WITH PROPOFOL;  Surgeon: [name], MD;  Location: [facility];  Service: Endoscopy;  Laterality: N/A;   DIAGNOSTIC LAPAROSCOPY     POLYPECTOMY  06/11/2017   Procedure: POLYPECTOMY;  Surgeon: [name], MD;  Location: [facility];  Service:  Endoscopy;;   UTERINE FIBROID SURGERY  08/06/2001     FAMILY HISTORY:  Family History  Problem Relation Age of Onset   Heart failure Mother    Diabetes Father 7   Healthy Sister    Healthy Brother      SOCIAL HISTORY:  reports that she has never smoked. She has never used smokeless tobacco. She reports that she does not drink alcohol and does not use drugs. The patient is single and lives in [place]. She works at [employer] in [place].    ALLERGIES: Patient has no known allergies.   MEDICATIONS:  Current Outpatient Medications  Medication Sig Dispense Refill   Ferrous Sulfate (IRON) 325 (65 Fe) MG TABS Take by mouth daily.     Multiple Vitamins-Calcium (ONE-A-DAY WOMENS PO) Take by mouth daily.     No current facility-administered medications for this encounter.     REVIEW OF SYSTEMS: On review of systems, the patient reports that she is doing well since surgery. She has had a bit of soreness in her right breast since surgery but no other complaints are verbalized.      PHYSICAL EXAM:  Wt Readings from Last 3 Encounters:  09/27/21 136 lb 9.6 oz (62 kg)  09/20/21 137 lb 9.1 oz (62.4 kg)  06/15/21 143 lb (64.9 kg)   Temp Readings from Last 3 Encounters:  09/27/21 97.9 F (36.6 C)  09/20/21 (!) 97.3 F (36.3 C) (Oral)  07/15/19 98.1 F (36.7 C) (Oral)  BP Readings from Last 3 Encounters:  09/27/21 (!) 100/58  09/20/21 113/69  06/15/21 104/67   Pulse Readings from Last 3 Encounters:  09/27/21 75  09/20/21 71  06/15/21 67     In general this is a well appearing African American female in no acute distress. She's alert and oriented x4 and appropriate throughout the examination. Cardiopulmonary assessment is negative for acute distress and she exhibits normal effort. Bilateral breast exam is deferred.    ECOG = 1  0 - Asymptomatic (Fully active, able to carry on all predisease activities without restriction)  1 - Symptomatic but  completely ambulatory (Restricted in physically strenuous activity but ambulatory and able to carry out work of a light or sedentary nature. For example, light housework, office work)  2 - Symptomatic, <50% in bed during the day (Ambulatory and capable of all self care but unable to carry out any work activities. Up and about more than 50% of waking hours)  3 - Symptomatic, >50% in bed, but not bedbound (Capable of only limited self-care, confined to bed or chair 50% or more of waking hours)  4 - Bedbound (Completely disabled. Cannot carry on any self-care. Totally confined to bed or chair)  5 - Death   Eustace Pen MM, Creech RH, Tormey DC, et al. [phone]). "Toxicity and response criteria of the [facility]". [place] Oncol. 5 (6): 649-55    LABORATORY DATA:  Lab Results  Component Value Date   WBC 4.1 06/15/2021   HGB 12.5 06/15/2021   HCT 37.4 06/15/2021   MCV 91.9 06/15/2021   PLT 288 06/15/2021   Lab Results  Component Value Date   NA 139 06/15/2021   K 4.8 06/15/2021   CL 103 06/15/2021   CO2 26 06/15/2021   Lab Results  Component Value Date   ALT 9 06/15/2021   AST 12 06/15/2021   ALKPHOS 42 11/05/2017   BILITOT 0.4 06/15/2021      RADIOGRAPHY: MM Breast Surgical Specimen  Result Date: 09/20/2021 CLINICAL DATA:  Evaluate surgical specimen following RIGHT lumpectomy for breast cancer. EXAM: SPECIMEN RADIOGRAPH OF THE RIGHT BREAST COMPARISON:  Previous exam(s). FINDINGS: Status post excision of the RIGHT breast. The radioactive seed, COIL and X biopsy marker clips are present and completely intact. IMPRESSION: Specimen radiograph of the RIGHT breast. Electronically Signed   By: Margarette Canada M.D.   On: 09/20/2021 09:57  MM RT RADIOACTIVE SEED LOC MAMMO GUIDE  Result Date: 09/16/2021 CLINICAL DATA:  56 year old female for radioactive seed localization of RIGHT breast cancer prior to lumpectomy. EXAM: MAMMOGRAPHIC GUIDED RADIOACTIVE SEED LOCALIZATION OF  THE RIGHT BREAST COMPARISON:  Previous exam(s). FINDINGS: Patient presents for radioactive seed localization prior to RIGHT lumpectomy. I met with the patient and we discussed the procedure of seed localization including benefits and alternatives. We discussed the high likelihood of a successful procedure. We discussed the risks of the procedure including infection, bleeding, tissue injury and further surgery. We discussed the low dose of radioactivity involved in the procedure. Informed, written consent was given. The usual time-out protocol was performed immediately prior to the procedure. Using mammographic guidance, sterile technique, 1% lidocaine and an I-125 radioactive seed, the residual malignant calcifications in the UPPER RIGHT breast was localized using a SUPERIOR approach. The follow-up mammogram images confirm the seed in the expected location and were marked for Dr. Georgette Dover. Follow-up survey of the patient confirms presence of the radioactive seed. Order number of I-125 seed:  [id number]. Total activity:  0.350 millicuries.  Reference Date: 08/12/2021 The patient tolerated the procedure well and was released from the [facility]. She was given instructions regarding seed removal. IMPRESSION: Radioactive seed localization RIGHT breast. Please note that residual malignant calcifications extend up to 1 cm anterior to the seed. No apparent complications. Electronically Signed   By: Margarette Canada M.D.   On: 09/16/2021 15:16  MM RT BREAST BX W LOC DEV 1ST LESION IMAGE BX SPEC STEREO GUIDE  Addendum Date: 09/13/2021   ADDENDUM REPORT: 09/13/2021 08:40 ADDENDUM: Pathology revealed GRADE 2 DUCTAL CARCINOMA IN SITU, SOLID AND CRIBRIFORM TYPE, NECROSIS: FOCALLY PRESENT- CALCIFICATIONS: PRESENT of the RIGHT breast, upper (X clip). This was found to be concordant by Dr. Hassan Rowan. Pathology results were discussed with the patient by telephone. The patient reported doing well after the biopsy with tenderness at the  site. Post biopsy instructions and care were reviewed and questions were answered. The patient was encouraged to call [facility] for any additional concerns. Surgical consultation has been arranged with Dr. Donnie Mesa at [facility] on September 12, 2021. Pathology results reported by Stacie Acres RN on 09/12/2021. Electronically Signed   By: Margarette Canada M.D.   On: 09/13/2021 08:40   Result Date: 09/13/2021 CLINICAL DATA:  56 year old female for tissue sampling of UPPER RIGHT breast calcifications. EXAM: RIGHT BREAST STEREOTACTIC CORE NEEDLE BIOPSY COMPARISON:  Previous exam(s). FINDINGS: The patient and I discussed the procedure of stereotactic-guided biopsy including benefits and alternatives. We discussed the high likelihood of a successful procedure. We discussed the risks of the procedure including infection, bleeding, tissue injury, clip migration, and inadequate sampling. Informed written consent was given. The usual time out protocol was performed immediately prior to the procedure. Using sterile technique and 1% Lidocaine with and without epinephrine as local anesthetic, under stereotactic guidance, a 9 gauge vacuum assisted device was used to perform core needle biopsy of the 1.3 cm group of UPPER RIGHT breast calcifications using a SUPERIOR approach. Specimen radiograph was performed showing calcifications. Specimens with calcifications are identified for pathology. At the conclusion of the procedure, an X shaped tissue marker clip was deployed into the biopsy cavity. Follow-up 2-view mammogram was performed and dictated separately. IMPRESSION: Stereotactic-guided biopsy of UPPER RIGHT breast calcifications (X clip). No apparent complications. Electronically Signed: By: Margarette Canada M.D. On: 09/08/2021 09:08  MM CLIP PLACEMENT RIGHT  Result Date: 09/08/2021 CLINICAL DATA:  Evaluate X biopsy clip placement following stereotactic guided RIGHT breast biopsy. EXAM: 3D  DIAGNOSTIC RIGHT MAMMOGRAM POST ULTRASOUND BIOPSY COMPARISON:  Previous exam(s). FINDINGS: 3D Mammographic images were obtained following 3D/stereotactic guided biopsy of 1.3 cm group of UPPER RIGHT breast calcifications. The X biopsy marking clip is located 0.6 cm INFERIOR to residual calcifications/biopsy cavity. IMPRESSION: X biopsy marking clip is located 0.6 cm INFERIOR to residual calcifications/biopsy cavity. Final Assessment: Post Procedure Mammograms for Marker Placement Electronically Signed   By: Margarette Canada M.D.   On: 09/08/2021 09:17  MM DIAG BREAST TOMO BILATERAL  Result Date: 08/30/2021 CLINICAL DATA:  Patient for follow-up of right breast calcifications at prior biopsy site. EXAM: DIGITAL DIAGNOSTIC BILATERAL MAMMOGRAM WITH TOMOSYNTHESIS AND CAD TECHNIQUE: Bilateral digital diagnostic mammography and breast tomosynthesis was performed. The images were evaluated with computer-aided detection. COMPARISON:  Previous exam(s). ACR Breast Density Category c: The breast tissue is heterogeneously dense, which may obscure small masses. FINDINGS: Magnification views of the right breast were obtained. Just lateral to the coil shaped marking clip is a  1.3 cm linearly oriented group of coarse heterogeneous calcifications. No additional masses, calcifications or nonsurgical distortion identified within either breast. IMPRESSION: Indeterminate linearly oriented coarse heterogeneous calcifications just lateral to the biopsy marking clip within the posterior aspect of the right breast. RECOMMENDATION: Stereotactic guided core needle biopsy of the linearly oriented coarse heterogeneous right breast calcifications. I have discussed the findings and recommendations with the patient. If applicable, a reminder letter will be sent to the patient regarding the next appointment. BI-RADS CATEGORY  4: Suspicious. Electronically Signed   By: Lovey Newcomer M.D.   On: 08/30/2021 12:11      IMPRESSION/PLAN: 1. ER/PR positive  Intermediate grade DCIS of the right breast. Dr. Lisbeth Renshaw discusses the final pathology findings and reviews the nature of early stage right breast disease. She has done well since surgery and would benefit from external radiotherapy to the breast  to reduce risks of local recurrence followed by antiestrogen therapy. We discussed the risks, benefits, short, and long term effects of radiotherapy, as well as the curative intent, and the patient is interested in proceeding. Dr. Lisbeth Renshaw discusses the delivery and logistics of radiotherapy and anticipates a course of 4 weeks of radiotherapy. Written consent is obtained and placed in the chart, a copy was provided to the patient. The patient will be contacted to coordinate treatment planning by our simulation department.     In a visit lasting 60 minutes, greater than 50% of the time was spent face to face reviewing her case, as well as in preparation of, discussing, and coordinating the patient's care.  The above documentation reflects my direct findings during this shared patient visit. Please see the separate note by Dr. Lisbeth Renshaw on this date for the remainder of the patient's plan of care.    Carola Rhine, [facility]    **Disclaimer: This note was dictated with voice recognition software. Similar sounding words can inadvertently be transcribed and this note may contain transcription errors which may not have been corrected upon publication of note.**

## 2021-09-26 NOTE — Progress Notes (Signed)
New Breast Cancer Diagnosis: Right Breast  Did patient present with symptoms (if so, please note symptoms) or screening mammography?:Screening Calcifications     Location and Extent of disease :right breast. Located in the upper right breast, measured 1.3 cm area of grouped coarse calcifications. Adenopathy no.  Histology per Pathology Report: grade 2, DCIS with calcifications and focal necrosis. 09/20/2021  Receptor Status: ER(positive), PR (positive), Her2-neu (), Ki-(%)  Surgeon and surgical plan, if any:  Dr. Georgette Dover -Right Breast Lumpectomy with radioactive seed localization 09/20/2021 -Follow-up 09/30/2021  Medical oncologist, treatment if any:   Dr. Chryl Heck 10/04/2021   Family History of Breast/Ovarian/Prostate Cancer:  None  Lymphedema issues, if any: No     Pain issues, if any:  She reports some soreness in her breast from recent surgery. 2-3/10.  SAFETY ISSUES: Prior radiation? No Pacemaker/ICD? No Possible current pregnancy? Irregular Periods, last period was earlier this year. Is the patient on methotrexate? No  Current Complaints / other details:

## 2021-09-27 ENCOUNTER — Ambulatory Visit
Admission: RE | Admit: 2021-09-27 | Discharge: 2021-09-27 | Disposition: A | Discharge status: Home / Self Care | Payer: BC Managed Care – PPO | Source: Ambulatory Visit | Attending: Radiation Oncology | Admitting: Radiation Oncology

## 2021-09-27 ENCOUNTER — Encounter: Payer: Self-pay | Admitting: Radiation Oncology

## 2021-09-27 DIAGNOSIS — D649 Anemia, unspecified: Secondary | ICD-10-CM | POA: Diagnosis not present

## 2021-09-27 DIAGNOSIS — D0511 Intraductal carcinoma in situ of right breast: Secondary | ICD-10-CM

## 2021-09-27 DIAGNOSIS — Z170 Estrogen receptor positive status [ER+]: Secondary | ICD-10-CM | POA: Insufficient documentation

## 2021-10-04 ENCOUNTER — Inpatient Hospital Stay: Payer: BC Managed Care – PPO

## 2021-10-04 ENCOUNTER — Other Ambulatory Visit: Payer: Self-pay

## 2021-10-04 ENCOUNTER — Encounter: Payer: Self-pay | Admitting: Hematology and Oncology

## 2021-10-04 ENCOUNTER — Inpatient Hospital Stay: Payer: BC Managed Care – PPO | Attending: Hematology and Oncology | Admitting: Hematology and Oncology

## 2021-10-04 DIAGNOSIS — D0511 Intraductal carcinoma in situ of right breast: Secondary | ICD-10-CM | POA: Diagnosis not present

## 2021-10-04 DIAGNOSIS — Z170 Estrogen receptor positive status [ER+]: Secondary | ICD-10-CM | POA: Diagnosis not present

## 2021-10-04 NOTE — Progress Notes (Signed)
[facility] CONSULT NOTE  Patient Care Team: Hali Marry, MD as PCP - General  CHIEF COMPLAINTS/PURPOSE OF CONSULTATION:  Newly diagnosed breast cancer  HISTORY OF PRESENTING ILLNESS:  Jasmine Good 56 y.o. female is here because of recent diagnosis of right breast DCIS    I reviewed her records extensively and collaborated the history with the patient.  SUMMARY OF ONCOLOGIC HISTORY: Oncology History  Ductal carcinoma in situ (DCIS) of right breast  08/30/2021 Mammogram   Diagnostic mammogram showed indeterminate linearly oriented coarse heterogeneous calcifications just lateral to the biopsy marking clip within the posterior aspect of the right breast.  X biopsy marking clip is located 0.6 cm inferior to residual calcifications.   09/20/2021 Pathology Results   Right breast lumpectomy showed DCIS, intermediate grade with calcifications discontinuously involving a fibrotic area of about 1.5 cm, no evidence of invasive carcinoma.  Resection margins are negative for DCIS, closest is a inferior margin of 0.3 cm.  Prognostics on the biopsy showed 95% positive strong staining for estrogen receptor, 90% positive strong staining for progesterone receptor.   09/22/2021 Initial Diagnosis   Ductal carcinoma in situ (DCIS) of right breast   10/04/2021 Cancer Staging   Staging form: Breast, AJCC 8th Edition - Clinical: Stage 0 (cTis (DCIS), cN0, cM0, G2, ER+, PR+) - Signed by Benay Pike, MD on 10/04/2021 Histologic grading system: 3 grade system    Patient arrived to the appointment today for an initial visit.  She is here by herself.  She denies any family history of breast cancer.  She has no children, otherwise denies any risk factors.  She has tolerated surgery well, healing well.  She is scheduled to see Dr. Lisbeth Renshaw on September 11, anticipates starting radiation on September 18 and will will last 4 weeks.  She denies any use of birth control or hormone replacement  therapy.  Rest of the pertinent 10 point ROS reviewed and negative  MEDICAL HISTORY:  Past Medical History:  Diagnosis Date   Anemia    Breast cancer ([place]) 09/20/2021   Cancer ([place]) 09/12/2021   DCIS R Breast   Hemoglobin low    Uterine fibroid     SURGICAL HISTORY: Past Surgical History:  Procedure Laterality Date   BREAST LUMPECTOMY WITH RADIOACTIVE SEED LOCALIZATION Right 09/20/2021   Procedure: RIGHT BREAST LUMPECTOMY WITH RADIOACTIVE SEED LOCALIZATION;  Surgeon: Donnie Mesa, MD;  Location: [facility];  Service: General;  Laterality: Right;   COLONOSCOPY WITH PROPOFOL N/A 06/11/2017   Procedure: COLONOSCOPY WITH PROPOFOL;  Surgeon: [name], MD;  Location: [facility];  Service: Endoscopy;  Laterality: N/A;   DIAGNOSTIC LAPAROSCOPY     POLYPECTOMY  06/11/2017   Procedure: POLYPECTOMY;  Surgeon: [name], MD;  Location: [facility];  Service: Endoscopy;;   UTERINE FIBROID SURGERY  08/06/2001    SOCIAL HISTORY: Social History   Socioeconomic History   Marital status: Single    Spouse name: Not on file   Number of children: 0   Years of education: Not on file   Highest education level: Not on file  Occupational History    Comment: [employer]  Tobacco Use   Smoking status: Never   Smokeless tobacco: Never  Vaping Use   Vaping Use: Never used  Substance and Sexual Activity   Alcohol use: No    Alcohol/week: 0.0 standard drinks of alcohol   Drug use: No   Sexual activity: Not Currently  Other Topics Concern  Not on file  Social History Narrative   regular exercise once every 2 weeks. No regular caffeine intake.     Social Determinants of Health   Financial Resource Strain: Not on file  Food Insecurity: Not on file  Transportation Needs: Not on file  Physical Activity: Not on file  Stress: Not on file  Social Connections: Not on file  Intimate Partner Violence: Not At Risk (09/27/2021)   Humiliation, Afraid, Rape,  and Kick questionnaire    Fear of Current or Ex-Partner: No    Emotionally Abused: No    Physically Abused: No    Sexually Abused: No    FAMILY HISTORY: Family History  Problem Relation Age of Onset   Heart failure Mother    Diabetes Father 66   Healthy Sister    Healthy Brother     ALLERGIES:  has No Known Allergies.  MEDICATIONS:  Current Outpatient Medications  Medication Sig Dispense Refill   Ferrous Sulfate (IRON) 325 (65 Fe) MG TABS Take by mouth daily.     Multiple Vitamins-Calcium (ONE-A-DAY WOMENS PO) Take by mouth daily.     No current facility-administered medications for this visit.   PHYSICAL EXAMINATION: ECOG PERFORMANCE STATUS: 0 - Asymptomatic  Vitals:   10/04/21 1405  BP: 113/70  Pulse: 73  Resp: 16  Temp: 99.1 F (37.3 C)  SpO2: 98%   Filed Weights   10/04/21 1405  Weight: 136 lb 12.8 oz (62.1 kg)    GENERAL:alert, no distress and comfortable BREAST: Right breast lumpectomy scar appears to be healing well  LABORATORY DATA:  I have reviewed the data as listed Lab Results  Component Value Date   WBC 4.1 06/15/2021   HGB 12.5 06/15/2021   HCT 37.4 06/15/2021   MCV 91.9 06/15/2021   PLT 288 06/15/2021   Lab Results  Component Value Date   NA 139 06/15/2021   K 4.8 06/15/2021   CL 103 06/15/2021   CO2 26 06/15/2021    RADIOGRAPHIC STUDIES: I have personally reviewed the radiological reports and agreed with the findings in the report.  ASSESSMENT AND PLAN:  Ductal carcinoma in situ (DCIS) of right breast This is a very pleasant 56 year old female patient with right breast DCIS status postlumpectomy, ER/PR positive referred to medical oncology for adjuvant antiestrogen therapy recommendations.  She has been healing well so far.  She denies any new complaints today.  She is scheduled to see Dr. Lisbeth Renshaw on September 11 and will have 4 weeks of adjuvant radiation.  We have discussed the following details about DCIS and antiestrogen therapy  today.  Pathology review: I discussed with the patient the difference between DCIS and invasive breast cancer. It is considered a precancerous lesion. DCIS is classified as a Stage 0 breast cancer. It is generally detected through mammograms as calcifications. We discussed the significance of grades and its impact on prognosis. We also discussed the importance of ER and PR receptors and their implications to adjuvant treatment options. Prognosis of DCIS dependence on grade and degree of comedo necrosis. It is anticipated that if not treated, 20-30% of DCIS can develop into invasive breast cancer.  Recommendation: 1. Breast conserving surgery 2. Followed by adjuvant radiation therapy 3. Followed by antiestrogen therapy with tamoxifen/aromatase inhibitors based on menopausal status 5 years  Tamoxifen counseling: We discussed the risks and benefits of tamoxifen. These include but not limited to insomnia, hot flashes, mood changes, vaginal dryness, and weight gain. Although rare, serious side effects including endometrial cancer, risk  of blood clots were also discussed. We strongly believe that the benefits far outweigh the risks. Patient understands these risks and consented to starting treatment. Planned treatment duration is 5 years.  Her last menstrual cycle was at the beginning of the year.  Since she is perimenopausal, we have focused our discussion on tamoxifen today.  She will return to clinic late October to initiate antiestrogen therapy.  We have also discussed about the specimen collection study and she is interested in participating.  Thank you for consulting [place] the care of this patient.  Please not hesitate to contact us with any additional questions or concerns.    All questions were answered. The patient knows to call the clinic with any problems, questions or concerns.    Benay Pike, MD 10/04/21

## 2021-10-04 NOTE — Assessment & Plan Note (Signed)
This is a very pleasant 56 year old female patient with right breast DCIS status postlumpectomy, ER/PR positive referred to medical oncology for adjuvant antiestrogen therapy recommendations.  She has been healing well so far.  She denies any new complaints today.  She is scheduled to see Dr. Lisbeth Renshaw on September 11 and will have 4 weeks of adjuvant radiation.  We have discussed the following details about DCIS and antiestrogen therapy today.  Pathology review: I discussed with the patient the difference between DCIS and invasive breast cancer. It is considered a precancerous lesion. DCIS is classified as a Stage 0 breast cancer. It is generally detected through mammograms as calcifications. We discussed the significance of grades and its impact on prognosis. We also discussed the importance of ER and PR receptors and their implications to adjuvant treatment options. Prognosis of DCIS dependence on grade and degree of comedo necrosis. It is anticipated that if not treated, 20-30% of DCIS can develop into invasive breast cancer.  Recommendation: 1. Breast conserving surgery 2. Followed by adjuvant radiation therapy 3. Followed by antiestrogen therapy with tamoxifen/aromatase inhibitors based on menopausal status 5 years  Tamoxifen counseling: We discussed the risks and benefits of tamoxifen. These include but not limited to insomnia, hot flashes, mood changes, vaginal dryness, and weight gain. Although rare, serious side effects including endometrial cancer, risk of blood clots were also discussed. We strongly believe that the benefits far outweigh the risks. Patient understands these risks and consented to starting treatment. Planned treatment duration is 5 years.  Her last menstrual cycle was at the beginning of the year.  Since she is perimenopausal, we have focused our discussion on tamoxifen today.  She will return to clinic late October to initiate antiestrogen therapy.  We have also discussed about  the specimen collection study and she is interested in participating.  Thank you for consulting [place] the care of this patient.  Please not hesitate to contact us with any additional questions or concerns.

## 2021-10-05 ENCOUNTER — Encounter: Payer: Self-pay | Admitting: *Deleted

## 2021-10-11 ENCOUNTER — Telehealth: Payer: Self-pay | Admitting: Licensed Clinical Social Worker

## 2021-10-11 NOTE — Telephone Encounter (Signed)
[facility] Clinical Social Work  Clinical Social Work was referred by new patient protocol for assessment of psychosocial needs.  Clinical Social Worker attempted to contact patient by phone  to offer support and assess for needs.   No answer. Left VM with direct contact information.      Craven Crean E Airika Alkhatib, LCSW  Clinical Social Worker [facility]        

## 2021-10-12 ENCOUNTER — Encounter (HOSPITAL_COMMUNITY): Payer: Self-pay

## 2021-10-17 ENCOUNTER — Other Ambulatory Visit: Payer: Self-pay

## 2021-10-17 ENCOUNTER — Ambulatory Visit
Admission: RE | Admit: 2021-10-17 | Discharge: 2021-10-17 | Disposition: A | Discharge status: Home / Self Care | Payer: BC Managed Care – PPO | Source: Ambulatory Visit | Attending: Radiation Oncology | Admitting: Radiation Oncology

## 2021-10-17 DIAGNOSIS — D0511 Intraductal carcinoma in situ of right breast: Secondary | ICD-10-CM | POA: Diagnosis not present

## 2021-10-17 DIAGNOSIS — Z510 Encounter for antineoplastic radiation therapy: Secondary | ICD-10-CM | POA: Insufficient documentation

## 2021-10-17 DIAGNOSIS — Z170 Estrogen receptor positive status [ER+]: Secondary | ICD-10-CM | POA: Diagnosis not present

## 2021-10-21 DIAGNOSIS — Z510 Encounter for antineoplastic radiation therapy: Secondary | ICD-10-CM | POA: Diagnosis not present

## 2021-10-21 DIAGNOSIS — D0511 Intraductal carcinoma in situ of right breast: Secondary | ICD-10-CM | POA: Diagnosis not present

## 2021-10-21 DIAGNOSIS — Z170 Estrogen receptor positive status [ER+]: Secondary | ICD-10-CM | POA: Diagnosis not present

## 2021-10-24 ENCOUNTER — Other Ambulatory Visit: Payer: Self-pay

## 2021-10-24 ENCOUNTER — Encounter: Payer: Self-pay | Admitting: *Deleted

## 2021-10-24 ENCOUNTER — Ambulatory Visit
Admission: RE | Admit: 2021-10-24 | Discharge: 2021-10-24 | Disposition: A | Discharge status: Home / Self Care | Payer: BC Managed Care – PPO | Source: Ambulatory Visit | Attending: Radiation Oncology | Admitting: Radiation Oncology

## 2021-10-24 DIAGNOSIS — D0511 Intraductal carcinoma in situ of right breast: Secondary | ICD-10-CM | POA: Diagnosis not present

## 2021-10-24 DIAGNOSIS — Z170 Estrogen receptor positive status [ER+]: Secondary | ICD-10-CM | POA: Diagnosis not present

## 2021-10-24 DIAGNOSIS — Z510 Encounter for antineoplastic radiation therapy: Secondary | ICD-10-CM | POA: Diagnosis not present

## 2021-10-24 LAB — RAD ONC ARIA SESSION SUMMARY
Course Elapsed Days: 0
Plan Fractions Treated to Date: 1
Plan Prescribed Dose Per Fraction: 2.66 Gy
Plan Total Fractions Prescribed: 16
Plan Total Prescribed Dose: 42.56 Gy
Reference Point Dosage Given to Date: 2.66 Gy
Reference Point Session Dosage Given: 2.66 Gy
Session Number: 1

## 2021-10-25 ENCOUNTER — Other Ambulatory Visit: Payer: Self-pay

## 2021-10-25 ENCOUNTER — Ambulatory Visit
Admission: RE | Admit: 2021-10-25 | Discharge: 2021-10-25 | Disposition: A | Discharge status: Home / Self Care | Payer: BC Managed Care – PPO | Source: Ambulatory Visit | Attending: Radiation Oncology | Admitting: Radiation Oncology

## 2021-10-25 DIAGNOSIS — D0511 Intraductal carcinoma in situ of right breast: Secondary | ICD-10-CM | POA: Diagnosis not present

## 2021-10-25 DIAGNOSIS — Z510 Encounter for antineoplastic radiation therapy: Secondary | ICD-10-CM | POA: Diagnosis not present

## 2021-10-25 DIAGNOSIS — Z170 Estrogen receptor positive status [ER+]: Secondary | ICD-10-CM | POA: Diagnosis not present

## 2021-10-25 LAB — RAD ONC ARIA SESSION SUMMARY
Course Elapsed Days: 1
Plan Fractions Treated to Date: 2
Plan Prescribed Dose Per Fraction: 2.66 Gy
Plan Total Fractions Prescribed: 16
Plan Total Prescribed Dose: 42.56 Gy
Reference Point Dosage Given to Date: 5.32 Gy
Reference Point Session Dosage Given: 2.66 Gy
Session Number: 2

## 2021-10-26 ENCOUNTER — Other Ambulatory Visit: Payer: Self-pay

## 2021-10-26 ENCOUNTER — Ambulatory Visit
Admission: RE | Admit: 2021-10-26 | Discharge: 2021-10-26 | Disposition: A | Discharge status: Home / Self Care | Payer: BC Managed Care – PPO | Source: Ambulatory Visit | Attending: Radiation Oncology | Admitting: Radiation Oncology

## 2021-10-26 DIAGNOSIS — Z510 Encounter for antineoplastic radiation therapy: Secondary | ICD-10-CM | POA: Diagnosis not present

## 2021-10-26 DIAGNOSIS — D0511 Intraductal carcinoma in situ of right breast: Secondary | ICD-10-CM | POA: Diagnosis not present

## 2021-10-26 DIAGNOSIS — Z170 Estrogen receptor positive status [ER+]: Secondary | ICD-10-CM | POA: Diagnosis not present

## 2021-10-26 LAB — RAD ONC ARIA SESSION SUMMARY
Course Elapsed Days: 2
Plan Fractions Treated to Date: 3
Plan Prescribed Dose Per Fraction: 2.66 Gy
Plan Total Fractions Prescribed: 16
Plan Total Prescribed Dose: 42.56 Gy
Reference Point Dosage Given to Date: 7.98 Gy
Reference Point Session Dosage Given: 2.66 Gy
Session Number: 3

## 2021-10-27 ENCOUNTER — Ambulatory Visit
Admission: RE | Admit: 2021-10-27 | Discharge: 2021-10-27 | Disposition: A | Discharge status: Home / Self Care | Payer: BC Managed Care – PPO | Source: Ambulatory Visit | Attending: Radiation Oncology | Admitting: Radiation Oncology

## 2021-10-27 ENCOUNTER — Other Ambulatory Visit: Payer: Self-pay

## 2021-10-27 DIAGNOSIS — Z510 Encounter for antineoplastic radiation therapy: Secondary | ICD-10-CM | POA: Diagnosis not present

## 2021-10-27 DIAGNOSIS — Z170 Estrogen receptor positive status [ER+]: Secondary | ICD-10-CM | POA: Diagnosis not present

## 2021-10-27 DIAGNOSIS — D0511 Intraductal carcinoma in situ of right breast: Secondary | ICD-10-CM | POA: Diagnosis not present

## 2021-10-27 LAB — RAD ONC ARIA SESSION SUMMARY
Course Elapsed Days: 3
Plan Fractions Treated to Date: 4
Plan Prescribed Dose Per Fraction: 2.66 Gy
Plan Total Fractions Prescribed: 16
Plan Total Prescribed Dose: 42.56 Gy
Reference Point Dosage Given to Date: 10.64 Gy
Reference Point Session Dosage Given: 2.66 Gy
Session Number: 4

## 2021-10-27 NOTE — Progress Notes (Signed)
Pt here for patient teaching.  Pt given Radiation and You booklet, skin care instructions, alra deodorant and Radiaplex.    Reviewed areas of pertinence such as fatigue, hair loss, skin changes, breast tenderness, and breast swelling. Pt able to give teach back of to pat skin and use unscented/gentle soap,apply Radiaplex bid, avoid applying anything to skin within 4 hours of treatment, avoid wearing an under wire bra, and to use an electric razor if they must shave. Pt verbalizes understanding of information given and will contact nursing with any questions or concerns.    Charlette Hennings M. Louellen Haldeman RN, BSN  

## 2021-10-28 ENCOUNTER — Other Ambulatory Visit: Payer: Self-pay

## 2021-10-28 ENCOUNTER — Ambulatory Visit
Admission: RE | Admit: 2021-10-28 | Discharge: 2021-10-28 | Disposition: A | Discharge status: Home / Self Care | Payer: BC Managed Care – PPO | Source: Ambulatory Visit | Attending: Radiation Oncology | Admitting: Radiation Oncology

## 2021-10-28 DIAGNOSIS — D0511 Intraductal carcinoma in situ of right breast: Secondary | ICD-10-CM

## 2021-10-28 DIAGNOSIS — Z170 Estrogen receptor positive status [ER+]: Secondary | ICD-10-CM | POA: Diagnosis not present

## 2021-10-28 DIAGNOSIS — Z510 Encounter for antineoplastic radiation therapy: Secondary | ICD-10-CM | POA: Diagnosis not present

## 2021-10-28 LAB — RAD ONC ARIA SESSION SUMMARY
Course Elapsed Days: 4
Plan Fractions Treated to Date: 5
Plan Prescribed Dose Per Fraction: 2.66 Gy
Plan Total Fractions Prescribed: 16
Plan Total Prescribed Dose: 42.56 Gy
Reference Point Dosage Given to Date: 13.3 Gy
Reference Point Session Dosage Given: 2.66 Gy
Session Number: 5

## 2021-10-28 MED ORDER — ALRA NON-METALLIC DEODORANT (RAD-ONC)
1.0000 | Freq: Once | TOPICAL | Status: AC
  Administered 2021-10-28: 1 via TOPICAL

## 2021-10-28 MED ORDER — RADIAPLEXRX EX GEL: Freq: Once | CUTANEOUS | Status: AC

## 2021-10-31 ENCOUNTER — Other Ambulatory Visit: Payer: Self-pay

## 2021-10-31 ENCOUNTER — Ambulatory Visit
Admission: RE | Admit: 2021-10-31 | Discharge: 2021-10-31 | Disposition: A | Discharge status: Home / Self Care | Payer: BC Managed Care – PPO | Source: Ambulatory Visit | Attending: Radiation Oncology | Admitting: Radiation Oncology

## 2021-10-31 DIAGNOSIS — Z510 Encounter for antineoplastic radiation therapy: Secondary | ICD-10-CM | POA: Diagnosis not present

## 2021-10-31 DIAGNOSIS — Z170 Estrogen receptor positive status [ER+]: Secondary | ICD-10-CM | POA: Diagnosis not present

## 2021-10-31 DIAGNOSIS — D0511 Intraductal carcinoma in situ of right breast: Secondary | ICD-10-CM | POA: Diagnosis not present

## 2021-10-31 LAB — RAD ONC ARIA SESSION SUMMARY
Course Elapsed Days: 7
Plan Fractions Treated to Date: 6
Plan Prescribed Dose Per Fraction: 2.66 Gy
Plan Total Fractions Prescribed: 16
Plan Total Prescribed Dose: 42.56 Gy
Reference Point Dosage Given to Date: 15.96 Gy
Reference Point Session Dosage Given: 2.66 Gy
Session Number: 6

## 2021-11-01 ENCOUNTER — Ambulatory Visit
Admission: RE | Admit: 2021-11-01 | Discharge: 2021-11-01 | Disposition: A | Discharge status: Home / Self Care | Payer: BC Managed Care – PPO | Source: Ambulatory Visit | Attending: Radiation Oncology | Admitting: Radiation Oncology

## 2021-11-01 ENCOUNTER — Other Ambulatory Visit: Payer: Self-pay

## 2021-11-01 DIAGNOSIS — Z510 Encounter for antineoplastic radiation therapy: Secondary | ICD-10-CM | POA: Diagnosis not present

## 2021-11-01 DIAGNOSIS — Z170 Estrogen receptor positive status [ER+]: Secondary | ICD-10-CM | POA: Diagnosis not present

## 2021-11-01 DIAGNOSIS — D0511 Intraductal carcinoma in situ of right breast: Secondary | ICD-10-CM | POA: Diagnosis not present

## 2021-11-01 LAB — RAD ONC ARIA SESSION SUMMARY
Course Elapsed Days: 8
Plan Fractions Treated to Date: 7
Plan Prescribed Dose Per Fraction: 2.66 Gy
Plan Total Fractions Prescribed: 16
Plan Total Prescribed Dose: 42.56 Gy
Reference Point Dosage Given to Date: 18.62 Gy
Reference Point Session Dosage Given: 2.66 Gy
Session Number: 7

## 2021-11-02 ENCOUNTER — Other Ambulatory Visit: Payer: Self-pay

## 2021-11-02 ENCOUNTER — Ambulatory Visit
Admission: RE | Admit: 2021-11-02 | Discharge: 2021-11-02 | Disposition: A | Discharge status: Home / Self Care | Payer: BC Managed Care – PPO | Source: Ambulatory Visit | Attending: Radiation Oncology | Admitting: Radiation Oncology

## 2021-11-02 DIAGNOSIS — D0511 Intraductal carcinoma in situ of right breast: Secondary | ICD-10-CM | POA: Diagnosis not present

## 2021-11-02 DIAGNOSIS — Z170 Estrogen receptor positive status [ER+]: Secondary | ICD-10-CM | POA: Diagnosis not present

## 2021-11-02 DIAGNOSIS — Z510 Encounter for antineoplastic radiation therapy: Secondary | ICD-10-CM | POA: Diagnosis not present

## 2021-11-02 LAB — RAD ONC ARIA SESSION SUMMARY
Course Elapsed Days: 9
Plan Fractions Treated to Date: 8
Plan Prescribed Dose Per Fraction: 2.66 Gy
Plan Total Fractions Prescribed: 16
Plan Total Prescribed Dose: 42.56 Gy
Reference Point Dosage Given to Date: 21.28 Gy
Reference Point Session Dosage Given: 2.66 Gy
Session Number: 8

## 2021-11-03 ENCOUNTER — Encounter: Payer: Self-pay | Admitting: Radiation Oncology

## 2021-11-03 ENCOUNTER — Other Ambulatory Visit: Payer: Self-pay

## 2021-11-03 ENCOUNTER — Ambulatory Visit
Admission: RE | Admit: 2021-11-03 | Discharge: 2021-11-03 | Disposition: A | Discharge status: Home / Self Care | Payer: BC Managed Care – PPO | Source: Ambulatory Visit | Attending: Radiation Oncology | Admitting: Radiation Oncology

## 2021-11-03 DIAGNOSIS — Z510 Encounter for antineoplastic radiation therapy: Secondary | ICD-10-CM | POA: Diagnosis not present

## 2021-11-03 DIAGNOSIS — Z170 Estrogen receptor positive status [ER+]: Secondary | ICD-10-CM | POA: Diagnosis not present

## 2021-11-03 DIAGNOSIS — D0511 Intraductal carcinoma in situ of right breast: Secondary | ICD-10-CM | POA: Diagnosis not present

## 2021-11-03 LAB — RAD ONC ARIA SESSION SUMMARY
Course Elapsed Days: 10
Plan Fractions Treated to Date: 9
Plan Prescribed Dose Per Fraction: 2.66 Gy
Plan Total Fractions Prescribed: 16
Plan Total Prescribed Dose: 42.56 Gy
Reference Point Dosage Given to Date: 23.94 Gy
Reference Point Session Dosage Given: 2.66 Gy
Session Number: 9

## 2021-11-03 NOTE — Progress Notes (Signed)
  Radiation Oncology         [phone] ________________________________  Name: Jasmine Good MRN: [id number]  Date: 11/03/2021  DOB: 11/26/1965  SIMULATION NOTE   NARRATIVE:  The patient underwent simulation today for ongoing radiation therapy.  The existing CT study set was employed for the purpose of virtual treatment planning.  The target and avoidance structures were reviewed and modified as necessary.  Treatment planning then occurred.  The radiation boost prescription was entered and confirmed.  A total of 1 complex treatment devices were fabricated in the form of an en face electron block/ field to shape radiation around the targets while maximally excluding nearby normal structures. I have requested : Isodose Plan.    PLAN:  This modified radiation beam arrangement is intended to continue the current radiation dose to an additional 8 Gy in 4 fractions for a total cumulative dose of 50.56 Gy.    ------------------------------------------------  Jodelle Gross, MD, PhD

## 2021-11-04 ENCOUNTER — Ambulatory Visit
Admission: RE | Admit: 2021-11-04 | Discharge: 2021-11-04 | Disposition: A | Discharge status: Home / Self Care | Payer: BC Managed Care – PPO | Source: Ambulatory Visit | Attending: Radiation Oncology | Admitting: Radiation Oncology

## 2021-11-04 ENCOUNTER — Other Ambulatory Visit: Payer: Self-pay

## 2021-11-04 DIAGNOSIS — Z170 Estrogen receptor positive status [ER+]: Secondary | ICD-10-CM | POA: Diagnosis not present

## 2021-11-04 DIAGNOSIS — Z510 Encounter for antineoplastic radiation therapy: Secondary | ICD-10-CM | POA: Diagnosis not present

## 2021-11-04 DIAGNOSIS — D0511 Intraductal carcinoma in situ of right breast: Secondary | ICD-10-CM | POA: Diagnosis not present

## 2021-11-04 LAB — RAD ONC ARIA SESSION SUMMARY
Course Elapsed Days: 11
Plan Fractions Treated to Date: 10
Plan Prescribed Dose Per Fraction: 2.66 Gy
Plan Total Fractions Prescribed: 16
Plan Total Prescribed Dose: 42.56 Gy
Reference Point Dosage Given to Date: 26.6 Gy
Reference Point Session Dosage Given: 2.66 Gy
Session Number: 10

## 2021-11-07 ENCOUNTER — Ambulatory Visit
Admission: RE | Admit: 2021-11-07 | Discharge: 2021-11-07 | Disposition: A | Discharge status: Home / Self Care | Payer: BC Managed Care – PPO | Source: Ambulatory Visit | Attending: Radiation Oncology | Admitting: Radiation Oncology

## 2021-11-07 ENCOUNTER — Other Ambulatory Visit: Payer: Self-pay

## 2021-11-07 DIAGNOSIS — Z510 Encounter for antineoplastic radiation therapy: Secondary | ICD-10-CM | POA: Insufficient documentation

## 2021-11-07 DIAGNOSIS — D0511 Intraductal carcinoma in situ of right breast: Secondary | ICD-10-CM | POA: Insufficient documentation

## 2021-11-07 DIAGNOSIS — Z170 Estrogen receptor positive status [ER+]: Secondary | ICD-10-CM | POA: Diagnosis not present

## 2021-11-07 LAB — RAD ONC ARIA SESSION SUMMARY
Course Elapsed Days: 14
Plan Fractions Treated to Date: 11
Plan Prescribed Dose Per Fraction: 2.66 Gy
Plan Total Fractions Prescribed: 16
Plan Total Prescribed Dose: 42.56 Gy
Reference Point Dosage Given to Date: 29.26 Gy
Reference Point Session Dosage Given: 2.66 Gy
Session Number: 11

## 2021-11-08 ENCOUNTER — Other Ambulatory Visit: Payer: Self-pay

## 2021-11-08 ENCOUNTER — Ambulatory Visit
Admission: RE | Admit: 2021-11-08 | Discharge: 2021-11-08 | Disposition: A | Discharge status: Home / Self Care | Payer: BC Managed Care – PPO | Source: Ambulatory Visit | Attending: Radiation Oncology | Admitting: Radiation Oncology

## 2021-11-08 DIAGNOSIS — Z170 Estrogen receptor positive status [ER+]: Secondary | ICD-10-CM | POA: Diagnosis not present

## 2021-11-08 DIAGNOSIS — D0511 Intraductal carcinoma in situ of right breast: Secondary | ICD-10-CM | POA: Diagnosis not present

## 2021-11-08 DIAGNOSIS — Z510 Encounter for antineoplastic radiation therapy: Secondary | ICD-10-CM | POA: Diagnosis not present

## 2021-11-08 LAB — RAD ONC ARIA SESSION SUMMARY
Course Elapsed Days: 15
Plan Fractions Treated to Date: 12
Plan Prescribed Dose Per Fraction: 2.66 Gy
Plan Total Fractions Prescribed: 16
Plan Total Prescribed Dose: 42.56 Gy
Reference Point Dosage Given to Date: 31.92 Gy
Reference Point Session Dosage Given: 2.66 Gy
Session Number: 12

## 2021-11-09 ENCOUNTER — Other Ambulatory Visit: Payer: Self-pay

## 2021-11-09 ENCOUNTER — Ambulatory Visit
Admission: RE | Admit: 2021-11-09 | Discharge: 2021-11-09 | Disposition: A | Discharge status: Home / Self Care | Payer: BC Managed Care – PPO | Source: Ambulatory Visit | Attending: Radiation Oncology | Admitting: Radiation Oncology

## 2021-11-09 DIAGNOSIS — D0511 Intraductal carcinoma in situ of right breast: Secondary | ICD-10-CM | POA: Diagnosis not present

## 2021-11-09 DIAGNOSIS — Z170 Estrogen receptor positive status [ER+]: Secondary | ICD-10-CM | POA: Diagnosis not present

## 2021-11-09 DIAGNOSIS — Z510 Encounter for antineoplastic radiation therapy: Secondary | ICD-10-CM | POA: Diagnosis not present

## 2021-11-09 LAB — RAD ONC ARIA SESSION SUMMARY
Course Elapsed Days: 16
Plan Fractions Treated to Date: 13
Plan Prescribed Dose Per Fraction: 2.66 Gy
Plan Total Fractions Prescribed: 16
Plan Total Prescribed Dose: 42.56 Gy
Reference Point Dosage Given to Date: 34.58 Gy
Reference Point Session Dosage Given: 2.66 Gy
Session Number: 13

## 2021-11-10 ENCOUNTER — Ambulatory Visit
Admission: RE | Admit: 2021-11-10 | Discharge: 2021-11-10 | Disposition: A | Discharge status: Home / Self Care | Payer: BC Managed Care – PPO | Source: Ambulatory Visit | Attending: Radiation Oncology | Admitting: Radiation Oncology

## 2021-11-10 ENCOUNTER — Other Ambulatory Visit: Payer: Self-pay

## 2021-11-10 DIAGNOSIS — D0511 Intraductal carcinoma in situ of right breast: Secondary | ICD-10-CM | POA: Diagnosis not present

## 2021-11-10 DIAGNOSIS — Z170 Estrogen receptor positive status [ER+]: Secondary | ICD-10-CM | POA: Diagnosis not present

## 2021-11-10 DIAGNOSIS — Z510 Encounter for antineoplastic radiation therapy: Secondary | ICD-10-CM | POA: Diagnosis not present

## 2021-11-10 LAB — RAD ONC ARIA SESSION SUMMARY
Course Elapsed Days: 17
Plan Fractions Treated to Date: 14
Plan Prescribed Dose Per Fraction: 2.66 Gy
Plan Total Fractions Prescribed: 16
Plan Total Prescribed Dose: 42.56 Gy
Reference Point Dosage Given to Date: 37.24 Gy
Reference Point Session Dosage Given: 2.66 Gy
Session Number: 14

## 2021-11-11 ENCOUNTER — Ambulatory Visit: Payer: BC Managed Care – PPO

## 2021-11-11 ENCOUNTER — Ambulatory Visit
Admission: RE | Admit: 2021-11-11 | Discharge: 2021-11-11 | Disposition: A | Discharge status: Home / Self Care | Payer: BC Managed Care – PPO | Source: Ambulatory Visit | Attending: Radiation Oncology | Admitting: Radiation Oncology

## 2021-11-11 ENCOUNTER — Other Ambulatory Visit: Payer: Self-pay

## 2021-11-11 DIAGNOSIS — D0511 Intraductal carcinoma in situ of right breast: Secondary | ICD-10-CM | POA: Diagnosis not present

## 2021-11-11 DIAGNOSIS — Z170 Estrogen receptor positive status [ER+]: Secondary | ICD-10-CM | POA: Diagnosis not present

## 2021-11-11 DIAGNOSIS — Z510 Encounter for antineoplastic radiation therapy: Secondary | ICD-10-CM | POA: Diagnosis not present

## 2021-11-11 LAB — RAD ONC ARIA SESSION SUMMARY
Course Elapsed Days: 18
Plan Fractions Treated to Date: 15
Plan Prescribed Dose Per Fraction: 2.66 Gy
Plan Total Fractions Prescribed: 16
Plan Total Prescribed Dose: 42.56 Gy
Reference Point Dosage Given to Date: 39.9 Gy
Reference Point Session Dosage Given: 2.66 Gy
Session Number: 15

## 2021-11-14 ENCOUNTER — Other Ambulatory Visit: Payer: Self-pay

## 2021-11-14 ENCOUNTER — Ambulatory Visit
Admission: RE | Admit: 2021-11-14 | Discharge: 2021-11-14 | Disposition: A | Discharge status: Home / Self Care | Payer: BC Managed Care – PPO | Source: Ambulatory Visit | Attending: Radiation Oncology | Admitting: Radiation Oncology

## 2021-11-14 DIAGNOSIS — Z170 Estrogen receptor positive status [ER+]: Secondary | ICD-10-CM | POA: Diagnosis not present

## 2021-11-14 DIAGNOSIS — D0511 Intraductal carcinoma in situ of right breast: Secondary | ICD-10-CM | POA: Diagnosis not present

## 2021-11-14 DIAGNOSIS — Z510 Encounter for antineoplastic radiation therapy: Secondary | ICD-10-CM | POA: Diagnosis not present

## 2021-11-14 LAB — RAD ONC ARIA SESSION SUMMARY
Course Elapsed Days: 21
Plan Fractions Treated to Date: 16
Plan Prescribed Dose Per Fraction: 2.66 Gy
Plan Total Fractions Prescribed: 16
Plan Total Prescribed Dose: 42.56 Gy
Reference Point Dosage Given to Date: 42.56 Gy
Reference Point Session Dosage Given: 2.66 Gy
Session Number: 16

## 2021-11-15 ENCOUNTER — Other Ambulatory Visit: Payer: Self-pay

## 2021-11-15 ENCOUNTER — Ambulatory Visit
Admission: RE | Admit: 2021-11-15 | Discharge: 2021-11-15 | Disposition: A | Discharge status: Home / Self Care | Payer: BC Managed Care – PPO | Source: Ambulatory Visit | Attending: Radiation Oncology | Admitting: Radiation Oncology

## 2021-11-15 DIAGNOSIS — Z510 Encounter for antineoplastic radiation therapy: Secondary | ICD-10-CM | POA: Diagnosis not present

## 2021-11-15 DIAGNOSIS — D0511 Intraductal carcinoma in situ of right breast: Secondary | ICD-10-CM | POA: Diagnosis not present

## 2021-11-15 LAB — RAD ONC ARIA SESSION SUMMARY
Course Elapsed Days: 22
Plan Fractions Treated to Date: 1
Plan Prescribed Dose Per Fraction: 2 Gy
Plan Total Fractions Prescribed: 4
Plan Total Prescribed Dose: 8 Gy
Reference Point Dosage Given to Date: 2 Gy
Reference Point Session Dosage Given: 2 Gy
Session Number: 17

## 2021-11-16 ENCOUNTER — Other Ambulatory Visit: Payer: Self-pay

## 2021-11-16 ENCOUNTER — Ambulatory Visit
Admission: RE | Admit: 2021-11-16 | Discharge: 2021-11-16 | Disposition: A | Discharge status: Home / Self Care | Payer: BC Managed Care – PPO | Source: Ambulatory Visit | Attending: Radiation Oncology | Admitting: Radiation Oncology

## 2021-11-16 DIAGNOSIS — D0511 Intraductal carcinoma in situ of right breast: Secondary | ICD-10-CM | POA: Diagnosis not present

## 2021-11-16 DIAGNOSIS — Z510 Encounter for antineoplastic radiation therapy: Secondary | ICD-10-CM | POA: Diagnosis not present

## 2021-11-16 LAB — RAD ONC ARIA SESSION SUMMARY
Course Elapsed Days: 23
Plan Fractions Treated to Date: 2
Plan Prescribed Dose Per Fraction: 2 Gy
Plan Total Fractions Prescribed: 4
Plan Total Prescribed Dose: 8 Gy
Reference Point Dosage Given to Date: 4 Gy
Reference Point Session Dosage Given: 2 Gy
Session Number: 18

## 2021-11-17 ENCOUNTER — Other Ambulatory Visit: Payer: Self-pay

## 2021-11-17 ENCOUNTER — Ambulatory Visit
Admission: RE | Admit: 2021-11-17 | Discharge: 2021-11-17 | Disposition: A | Discharge status: Home / Self Care | Payer: BC Managed Care – PPO | Source: Ambulatory Visit | Attending: Radiation Oncology | Admitting: Radiation Oncology

## 2021-11-17 ENCOUNTER — Encounter: Payer: Self-pay | Admitting: *Deleted

## 2021-11-17 DIAGNOSIS — D0511 Intraductal carcinoma in situ of right breast: Secondary | ICD-10-CM

## 2021-11-17 DIAGNOSIS — Z510 Encounter for antineoplastic radiation therapy: Secondary | ICD-10-CM | POA: Diagnosis not present

## 2021-11-17 LAB — RAD ONC ARIA SESSION SUMMARY
Course Elapsed Days: 24
Plan Fractions Treated to Date: 3
Plan Prescribed Dose Per Fraction: 2 Gy
Plan Total Fractions Prescribed: 4
Plan Total Prescribed Dose: 8 Gy
Reference Point Dosage Given to Date: 6 Gy
Reference Point Session Dosage Given: 2 Gy
Session Number: 19

## 2021-11-18 ENCOUNTER — Encounter: Payer: Self-pay | Admitting: Radiation Oncology

## 2021-11-18 ENCOUNTER — Ambulatory Visit
Admission: RE | Admit: 2021-11-18 | Discharge: 2021-11-18 | Disposition: A | Discharge status: Home / Self Care | Payer: BC Managed Care – PPO | Source: Ambulatory Visit | Attending: Radiation Oncology | Admitting: Radiation Oncology

## 2021-11-18 ENCOUNTER — Other Ambulatory Visit: Payer: Self-pay

## 2021-11-18 DIAGNOSIS — D0511 Intraductal carcinoma in situ of right breast: Secondary | ICD-10-CM | POA: Diagnosis not present

## 2021-11-18 DIAGNOSIS — Z170 Estrogen receptor positive status [ER+]: Secondary | ICD-10-CM | POA: Diagnosis not present

## 2021-11-18 DIAGNOSIS — Z510 Encounter for antineoplastic radiation therapy: Secondary | ICD-10-CM | POA: Diagnosis not present

## 2021-11-18 LAB — RAD ONC ARIA SESSION SUMMARY
Course Elapsed Days: 25
Plan Fractions Treated to Date: 4
Plan Prescribed Dose Per Fraction: 2 Gy
Plan Total Fractions Prescribed: 4
Plan Total Prescribed Dose: 8 Gy
Reference Point Dosage Given to Date: 8 Gy
Reference Point Session Dosage Given: 2 Gy
Session Number: 20

## 2021-11-30 ENCOUNTER — Inpatient Hospital Stay: Payer: BC Managed Care – PPO | Attending: Hematology and Oncology | Admitting: Hematology and Oncology

## 2021-11-30 ENCOUNTER — Other Ambulatory Visit: Payer: Self-pay

## 2021-11-30 ENCOUNTER — Encounter: Payer: Self-pay | Admitting: Hematology and Oncology

## 2021-11-30 DIAGNOSIS — Z79810 Long term (current) use of selective estrogen receptor modulators (SERMs): Secondary | ICD-10-CM | POA: Diagnosis not present

## 2021-11-30 DIAGNOSIS — Z923 Personal history of irradiation: Secondary | ICD-10-CM | POA: Diagnosis not present

## 2021-11-30 DIAGNOSIS — D0511 Intraductal carcinoma in situ of right breast: Secondary | ICD-10-CM | POA: Insufficient documentation

## 2021-11-30 MED ORDER — TAMOXIFEN CITRATE 20 MG PO TABS: 20.0000 mg | ORAL_TABLET | Freq: Every day | ORAL | 3 refills | Status: DC

## 2021-11-30 NOTE — Assessment & Plan Note (Addendum)
This is a very pleasant 56 year old female patient with right breast DCIS status postlumpectomy, ER/PR positive referred to medical oncology for adjuvant antiestrogen therapy recommendations.  She is now status post adjuvant radiation and is here to initiate antiestrogen therapy.  We have on multiple occasions discussed about DCIS in general, treatment recommendations, role of adjuvant antiestrogen therapy.  She is perimenopausal, hence we have discussed about starting tamoxifen. We have once again discussed about role of antiestrogen therapy, adverse effects of tamoxifen including but not limited to DVT/PE, endometrial hyperplasia and rarely endometrial cancer.  Tamoxifen is very effective in reducing the risk of breast cancer as well as an improving bone density.   Prescription dispensed to the pharmacy of her choice.   On brief physical exam, right breast with postradiation changes, some mild erythema but no other evidence of infection. Return to clinic in 3 months for survivorship clinic and toxicity check.  Bilateral breast mammogram ordered for July 2024.  Plan is to proceed with 5 years of antiestrogen therapy.  If she cannot tolerate 20 mg full dose, we can consider dose reduction. Thank you for consulting [place] in the care of this patient.  Please do not hesitate to contact us with any additional questions or concerns.

## 2021-11-30 NOTE — Progress Notes (Signed)
[facility] NOTE  Patient Care Team: Hali Marry, MD as PCP - General Rockwell Germany, RN as Oncology Nurse Navigator Tressie Ellis, Paulette Blanch, RN as Oncology Nurse Navigator Benay Pike, MD as Consulting Physician (Hematology and Oncology) Donnie Mesa, MD as Consulting Physician (General Surgery) Kyung Rudd, MD as Consulting Physician (Radiation Oncology)  CHIEF COMPLAINTS/PURPOSE OF CONSULTATION:  Newly diagnosed breast cancer  HISTORY OF PRESENTING ILLNESS:  Jasmine Good 55 y.o. female is here because of recent diagnosis of right breast DCIS    I reviewed her records extensively and collaborated the history with the patient.  SUMMARY OF ONCOLOGIC HISTORY: Oncology History  Ductal carcinoma in situ (DCIS) of right breast  08/30/2021 Mammogram   Diagnostic mammogram showed indeterminate linearly oriented coarse heterogeneous calcifications just lateral to the biopsy marking clip within the posterior aspect of the right breast.  X biopsy marking clip is located 0.6 cm inferior to residual calcifications.   09/20/2021 Pathology Results   Right breast lumpectomy showed DCIS, intermediate grade with calcifications discontinuously involving a fibrotic area of about 1.5 cm, no evidence of invasive carcinoma.  Resection margins are negative for DCIS, closest is a inferior margin of 0.3 cm.  Prognostics on the biopsy showed 95% positive strong staining for estrogen receptor, 90% positive strong staining for progesterone receptor.   09/20/2021 Definitive Surgery   Right breast lumpectomy with DCIS, intermediate grade with calcifications, discontinuously involving a fibrotic area of about 1.5 cm.  No evidence of invasive carcinoma.  Resection margins are negative for DCIS, closest is the inferior margin at 0.3 cm.  Prior prognostic showed ER 95% positive strong staining PR 90% positive strong staining   09/22/2021 Initial Diagnosis   Ductal carcinoma in situ  (DCIS) of right breast   10/04/2021 Cancer Staging   Staging form: Breast, AJCC 8th Edition - Clinical: Stage 0 (cTis (DCIS), cN0, cM0, G2, ER+, PR+) - Signed by Benay Pike, MD on 10/04/2021 Histologic grading system: 3 grade system    Interval history Jasmine Good is here for follow-up after radiation.  She did really well with radiation.  She has some skin changes otherwise tolerated it well.  No other complaints.  Her last menstrual cycle was early this year.  Rest of the pertinent 10 point ROS reviewed and negative  MEDICAL HISTORY:  Past Medical History:  Diagnosis Date   Anemia    Breast cancer ([place]) 09/20/2021   Cancer ([place]) 09/12/2021   DCIS R Breast   Hemoglobin low    Uterine fibroid     SURGICAL HISTORY: Past Surgical History:  Procedure Laterality Date   BREAST LUMPECTOMY WITH RADIOACTIVE SEED LOCALIZATION Right 09/20/2021   Procedure: RIGHT BREAST LUMPECTOMY WITH RADIOACTIVE SEED LOCALIZATION;  Surgeon: Donnie Mesa, MD;  Location: [facility];  Service: General;  Laterality: Right;   COLONOSCOPY WITH PROPOFOL N/A 06/11/2017   Procedure: COLONOSCOPY WITH PROPOFOL;  Surgeon: [name], MD;  Location: [facility];  Service: Endoscopy;  Laterality: N/A;   DIAGNOSTIC LAPAROSCOPY     POLYPECTOMY  06/11/2017   Procedure: POLYPECTOMY;  Surgeon: [name], MD;  Location: [facility];  Service: Endoscopy;;   UTERINE FIBROID SURGERY  08/06/2001    SOCIAL HISTORY: Social History   Socioeconomic History   Marital status: Single    Spouse name: Not on file   Number of children: 0   Years of education: Not on file   Highest education level: Not on file  Occupational History  Comment: [employer]  Tobacco Use   Smoking status: Never   Smokeless tobacco: Never  Vaping Use   Vaping Use: Never used  Substance and Sexual Activity   Alcohol use: No    Alcohol/week: 0.0 standard drinks of alcohol   Drug use: No   Sexual  activity: Not Currently  Other Topics Concern   Not on file  Social History Narrative   regular exercise once every 2 weeks. No regular caffeine intake.     Social Determinants of Health   Financial Resource Strain: Not on file  Food Insecurity: Not on file  Transportation Needs: Not on file  Physical Activity: Not on file  Stress: Not on file  Social Connections: Not on file  Intimate Partner Violence: Not At Risk (09/27/2021)   Humiliation, Afraid, Rape, and Kick questionnaire    Fear of Current or Ex-Partner: No    Emotionally Abused: No    Physically Abused: No    Sexually Abused: No    FAMILY HISTORY: Family History  Problem Relation Age of Onset   Heart failure Mother    Diabetes Father 73   Healthy Sister    Healthy Brother     ALLERGIES:  has No Known Allergies.  MEDICATIONS:  Current Outpatient Medications  Medication Sig Dispense Refill   tamoxifen (NOLVADEX) 20 MG tablet Take 1 tablet (20 mg total) by mouth daily. 90 tablet 3   Ferrous Sulfate (IRON) 325 (65 Fe) MG TABS Take by mouth daily.     Multiple Vitamins-Calcium (ONE-A-DAY WOMENS PO) Take by mouth daily.     No current facility-administered medications for this visit.   PHYSICAL EXAMINATION: ECOG PERFORMANCE STATUS: 0 - Asymptomatic  Vitals:   11/30/21 0825  BP: 106/68  Pulse: 66  Resp: 16  Temp: 97.9 F (36.6 C)  SpO2: 100%    Filed Weights   11/30/21 0825  Weight: 138 lb 9.6 oz (62.9 kg)     GENERAL:alert, no distress and comfortable   LABORATORY DATA:  I have reviewed the data as listed Lab Results  Component Value Date   WBC 4.1 06/15/2021   HGB 12.5 06/15/2021   HCT 37.4 06/15/2021   MCV 91.9 06/15/2021   PLT 288 06/15/2021   Lab Results  Component Value Date   NA 139 06/15/2021   K 4.8 06/15/2021   CL 103 06/15/2021   CO2 26 06/15/2021    RADIOGRAPHIC STUDIES: I have personally reviewed the radiological reports and agreed with the findings in the  report.  ASSESSMENT AND PLAN:   Ductal carcinoma in situ (DCIS) of right breast This is a very pleasant 56 year old female patient with right breast DCIS status postlumpectomy, ER/PR positive referred to medical oncology for adjuvant antiestrogen therapy recommendations.  She is now status post adjuvant radiation and is here to initiate antiestrogen therapy.  We have on multiple occasions discussed about DCIS in general, treatment recommendations, role of adjuvant antiestrogen therapy.  She is perimenopausal, hence we have discussed about starting tamoxifen. We have once again discussed about role of antiestrogen therapy, adverse effects of tamoxifen including but not limited to DVT/PE, endometrial hyperplasia and rarely endometrial cancer.  Tamoxifen is very effective in reducing the risk of breast cancer as well as an improving bone density.   Prescription dispensed to the pharmacy of her choice.   On brief physical exam, right breast with postradiation changes, some mild erythema but no other evidence of infection. Return to clinic in 3 months for survivorship clinic and  toxicity check.  Bilateral breast mammogram ordered for July 2024.  Plan is to proceed with 5 years of antiestrogen therapy.  If she cannot tolerate 20 mg full dose, we can consider dose reduction. Thank you for consulting [place] in the care of this patient.  Please do not hesitate to contact us with any additional questions or concerns.   Total time spent: 30 minutes including history, physical, review of records, counseling and coordination of care All questions were answered. The patient knows to call the clinic with any problems, questions or concerns.    Benay Pike, MD 11/30/21

## 2021-12-12 NOTE — Progress Notes (Signed)
                                                                                                                                                             Patient Name: Jasmine Good MRN: [id number] DOB: [birth year] Referring Physician: Beatrice Lecher (Profile Not Attached) Date of Service: 11/18/2021 [facility] [place]                                                        End Of Treatment Note  Diagnoses: D05.11-Intraductal carcinoma in situ of right breast  Cancer Staging: ER/PR positive, Intermediate grade DCIS of the right breast   Intent: Curative  Radiation Treatment Dates: 10/24/2021 through 11/18/2021 Site Technique Total Dose (Gy) Dose per Fx (Gy) Completed Fx Beam Energies  Breast, Right: Breast_R 3D 42.56/42.56 2.66 16/16 6XFFF  Breast, Right: Breast_R_Bst specialPort 8/8 2 4/4 9E, 12E   Narrative: The patient tolerated radiation therapy relatively well. She developed fatigue and anticipated skin changes in the treatment field.   Plan: The patient will receive a call in about one month from the radiation oncology department. She will continue follow up with Dr. Chryl Heck as well.   ________________________________________________    Carola Rhine, [facility]

## 2022-01-02 ENCOUNTER — Ambulatory Visit
Admission: RE | Admit: 2022-01-02 | Discharge: 2022-01-02 | Disposition: A | Discharge status: Home / Self Care | Payer: BC Managed Care – PPO | Source: Ambulatory Visit | Attending: Adult Health | Admitting: Adult Health

## 2022-01-02 NOTE — Progress Notes (Signed)
.  nurse

## 2022-01-02 NOTE — Progress Notes (Signed)
  Radiation Oncology         [phone] ________________________________  Name: Jasmine Good MRN: [id number]  Date of Service: 01/02/2022  DOB: Apr 24, 1965  Post Treatment Telephone Note  Diagnosis:  ER/PR positive, Intermediate grade DCIS of the right breast   Intent: Curative  Radiation Treatment Dates: 10/24/2021 through 11/18/2021 Site Technique Total Dose (Gy) Dose per Fx (Gy) Completed Fx Beam Energies  Breast, Right: Breast_R 3D 42.56/42.56 2.66 16/16 6XFFF  Breast, Right: Breast_R_Bst specialPort 8/8 2 4/4 9E, 12E  (as documented in provider EOT note)   The patient was not available for call today. A voicemail was left.  The patient was encouraged to avoid sun exposure in the area of prior treatment for up to one year following radiation with either sunscreen or by the style of clothing worn in the sun.  The patient has scheduled follow up with her medical oncologist Dr. Chryl Heck for ongoing surveillance, and was encouraged to call if she develops concerns or questions regarding radiation.   This concludes the interview.   Leandra Kern, LPN

## 2022-03-02 ENCOUNTER — Encounter: Payer: Self-pay | Admitting: Adult Health

## 2022-03-02 ENCOUNTER — Inpatient Hospital Stay: Payer: BC Managed Care – PPO | Attending: Adult Health | Admitting: Adult Health

## 2022-03-02 ENCOUNTER — Other Ambulatory Visit: Payer: Self-pay

## 2022-03-02 VITALS — BP 103/62 | HR 69 | Temp 97.3°F | Resp 17 | Wt 134.1 lb

## 2022-03-02 DIAGNOSIS — D0511 Intraductal carcinoma in situ of right breast: Secondary | ICD-10-CM | POA: Diagnosis not present

## 2022-03-02 DIAGNOSIS — Z170 Estrogen receptor positive status [ER+]: Secondary | ICD-10-CM | POA: Diagnosis not present

## 2022-03-02 DIAGNOSIS — Z923 Personal history of irradiation: Secondary | ICD-10-CM | POA: Diagnosis not present

## 2022-03-02 DIAGNOSIS — Z79810 Long term (current) use of selective estrogen receptor modulators (SERMs): Secondary | ICD-10-CM | POA: Insufficient documentation

## 2022-03-02 NOTE — Progress Notes (Signed)
SURVIVORSHIP VISIT:   BRIEF ONCOLOGIC HISTORY:  Oncology History  Ductal carcinoma in situ (DCIS) of right breast  08/30/2021 Mammogram   Diagnostic mammogram showed indeterminate linearly oriented coarse heterogeneous calcifications just lateral to the biopsy marking clip within the posterior aspect of the right breast.  X biopsy marking clip is located 0.6 cm inferior to residual calcifications.   09/20/2021 Pathology Results   Right breast lumpectomy showed DCIS, intermediate grade with calcifications discontinuously involving a fibrotic area of about 1.5 cm, no evidence of invasive carcinoma.  Resection margins are negative for DCIS, closest is a inferior margin of 0.3 cm.  Prognostics on the biopsy showed 95% positive strong staining for estrogen receptor, 90% positive strong staining for progesterone receptor.   09/20/2021 Definitive Surgery   Right breast lumpectomy with DCIS, intermediate grade with calcifications, discontinuously involving a fibrotic area of about 1.5 cm.  No evidence of invasive carcinoma.  Resection margins are negative for DCIS, closest is the inferior margin at 0.3 cm.  Prior prognostic showed ER 95% positive strong staining PR 90% positive strong staining   10/04/2021 Cancer Staging   Staging form: Breast, AJCC 8th Edition - Clinical: Stage 0 (cTis (DCIS), cN0, cM0, G2, ER+, PR+) - Signed by Benay Pike, MD on 10/04/2021 Histologic grading system: 3 grade system   10/24/2021 - 11/18/2021 Radiation Therapy   Site Technique Total Dose (Gy) Dose per Fx (Gy) Completed Fx Beam Energies  Breast, Right: Breast_R 3D 42.56/42.56 2.66 16/16 6XFFF  Breast, Right: Breast_R_Bst specialPort 8/8 2 4/4 9E, 12E     12/2021 -  Anti-estrogen oral therapy   Tamoxifen     INTERVAL HISTORY:  Ms. Jasmine Good to review her survivorship care plan detailing her treatment course for breast cancer, as well as monitoring long-term side effects of that treatment, education regarding health  maintenance, screening, and overall wellness and health promotion.     Overall, Ms. Jasmine Good reports feeling quite well.  She is taking tamoxifen daily.  Her main issue with this medication is hot flashes that occur a couple times in the evening.    REVIEW OF SYSTEMS:  Review of Systems  Constitutional:  Negative for appetite change, chills, fatigue, fever and unexpected weight change.  HENT:   Negative for hearing loss, lump/mass and trouble swallowing.   Eyes:  Negative for eye problems and icterus.  Respiratory:  Negative for chest tightness, cough and shortness of breath.   Cardiovascular:  Negative for chest pain, leg swelling and palpitations.  Gastrointestinal:  Negative for abdominal distention, abdominal pain, constipation, diarrhea, nausea and vomiting.  Endocrine: Positive for hot flashes.  Genitourinary:  Negative for difficulty urinating.   Musculoskeletal:  Negative for arthralgias.  Skin:  Negative for itching and rash.  Neurological:  Negative for dizziness, extremity weakness, headaches and numbness.  Hematological:  Negative for adenopathy. Does not bruise/bleed easily.  Psychiatric/Behavioral:  Negative for depression. The patient is not nervous/anxious.    Breast: Denies any new nodularity, masses, tenderness, nipple changes, or nipple discharge.         PAST MEDICAL/SURGICAL HISTORY:  Past Medical History:  Diagnosis Date   Anemia    Breast cancer ([facility]) 09/20/2021   Cancer ([facility]) 09/12/2021   DCIS R Breast   Hemoglobin low    Uterine fibroid    Past Surgical History:  Procedure Laterality Date   BREAST LUMPECTOMY WITH RADIOACTIVE SEED LOCALIZATION Right 09/20/2021   Procedure: RIGHT BREAST LUMPECTOMY WITH RADIOACTIVE SEED LOCALIZATION;  Surgeon: Donnie Mesa, MD;  Location:  [facility];  Service: General;  Laterality: Right;   COLONOSCOPY WITH PROPOFOL N/A 06/11/2017   Procedure: COLONOSCOPY WITH PROPOFOL;  Surgeon: [name], MD;  Location:  [facility];  Service: Endoscopy;  Laterality: N/A;   DIAGNOSTIC LAPAROSCOPY     POLYPECTOMY  06/11/2017   Procedure: POLYPECTOMY;  Surgeon: [name], MD;  Location: [facility];  Service: Endoscopy;;   UTERINE FIBROID SURGERY  08/06/2001     ALLERGIES:  No Known Allergies   CURRENT MEDICATIONS:  Outpatient Encounter Medications as of 03/02/2022  Medication Sig   Ferrous Sulfate (IRON) 325 (65 Fe) MG TABS Take by mouth daily.   Multiple Vitamins-Calcium (ONE-A-DAY WOMENS PO) Take by mouth daily.   tamoxifen (NOLVADEX) 20 MG tablet Take 1 tablet (20 mg total) by mouth daily.   No facility-administered encounter medications on file as of 03/02/2022.     ONCOLOGIC FAMILY HISTORY:  Family History  Problem Relation Age of Onset   Heart failure Mother    Diabetes Father 57   Healthy Sister    Healthy Brother     SOCIAL HISTORY:  Social History   Socioeconomic History   Marital status: Single    Spouse name: Not on file   Number of children: 0   Years of education: Not on file   Highest education level: Not on file  Occupational History    Comment: [employer]  Tobacco Use   Smoking status: Never   Smokeless tobacco: Never  Vaping Use   Vaping Use: Never used  Substance and Sexual Activity   Alcohol use: No    Alcohol/week: 0.0 standard drinks of alcohol   Drug use: No   Sexual activity: Not Currently  Other Topics Concern   Not on file  Social History Narrative   regular exercise once every 2 weeks. No regular caffeine intake.     Social Determinants of Health   Financial Resource Strain: Not on file  Food Insecurity: Not on file  Transportation Needs: Not on file  Physical Activity: Not on file  Stress: Not on file  Social Connections: Not on file  Intimate Partner Violence: Not At Risk (09/27/2021)   Humiliation, Afraid, Rape, and Kick questionnaire    Fear of Current or Ex-Partner: No    Emotionally Abused: No    Physically  Abused: No    Sexually Abused: No     OBSERVATIONS/OBJECTIVE:  BP 103/62 (BP Location: Left Arm, Patient Position: Sitting)   Pulse 69   Temp (!) 97.3 F (36.3 C) (Temporal)   Resp 17   Wt 134 lb 2 oz (60.8 kg)   SpO2 98%   BMI 22.32 kg/m  GENERAL: Patient is a well appearing female in no acute distress HEENT:  Sclerae anicteric.  Oropharynx clear and moist. No ulcerations or evidence of oropharyngeal candidiasis. Neck is supple.  NODES:  No cervical, supraclavicular, or axillary lymphadenopathy palpated.  BREAST EXAM: Right breast status postlumpectomy and radiation no signs of local recurrence left breast is benign LUNGS:  Clear to auscultation bilaterally.  No wheezes or rhonchi. HEART:  Regular rate and rhythm. No murmur appreciated. ABDOMEN:  Soft, nontender.  Positive, normoactive bowel sounds. No organomegaly palpated. MSK:  No focal spinal tenderness to palpation. Full range of motion bilaterally in the upper extremities. EXTREMITIES:  No peripheral edema.   SKIN:  Clear with no obvious rashes or skin changes. No nail dyscrasia. NEURO:  Nonfocal. Well oriented.  Appropriate affect.   LABORATORY DATA:  None for this visit.  DIAGNOSTIC IMAGING:  None for this visit.      ASSESSMENT AND PLAN:  Ms.. Good is a pleasant 57 y.o. female with Stage 0 right breast DCIS, ER+/PR+, diagnosed in August 2023, treated with lumpectomy, adjuvant radiation therapy, and anti-estrogen therapy with tamoxifen beginning in 12/2021.  She presents to the Survivorship Clinic for our initial meeting and routine follow-up post-completion of treatment for breast cancer.    1. Stage 0 right breast cancer:  Jasmine Good is continuing to recover from definitive treatment for breast cancer. She will follow-up with her medical oncologist, Dr. Chryl Heck in 6 months with history and physical exam per surveillance protocol.  She will continue her anti-estrogen therapy with Tamoxifen. Thus far, she is tolerating  the Tamoxifen well, with minimal side effects. Her mammogram is due 08/2022; orders placed today. Today, a comprehensive survivorship care plan and treatment summary was reviewed with the patient today detailing her breast cancer diagnosis, treatment course, potential late/long-term effects of treatment, appropriate follow-up care with recommendations for the future, and patient education resources.  A copy of this summary, along with a letter will be sent to the patient's primary care provider via mail/fax/In Basket message after today's visit.    2. Bone health:   She was given education on specific activities to promote bone health.  3. Cancer screening:  Due to Ms. Cuddeback's history and her age, she should receive screening for skin cancers, colon cancer, and gynecologic cancers.  The information and recommendations are listed on the patient's comprehensive care plan/treatment summary and were reviewed in detail with the patient.    4. Health maintenance and wellness promotion: Ms. Jansson was encouraged to consume 5-7 servings of fruits and vegetables per day. We reviewed the "Nutrition Rainbow" handout.  She was also encouraged to engage in moderate to vigorous exercise for 30 minutes per day most days of the week. She was instructed to limit her alcohol consumption and continue to abstain from tobacco use.     5. Support services/counseling: It is not uncommon for this period of the patient's cancer care trajectory to be one of many emotions and stressors.  She was given information regarding our available services and encouraged to contact me with any questions or for help enrolling in any of our support group/programs.    Follow up instructions:    -Return to cancer center in 6 months for f/u with Dr. Chryl Heck  -Mammogram due in 08/2022 -She is welcome to return back to the Survivorship Clinic at any time; no additional follow-up needed at this time.  -Consider referral back to survivorship as a  long-term survivor for continued surveillance  The patient was provided an opportunity to ask questions and all were answered. The patient agreed with the plan and demonstrated an understanding of the instructions.   Total encounter time:30 minutes*in face-to-face visit time, chart review, lab review, care coordination, order entry, and documentation of the encounter time.    Wilber Bihari, NP 03/02/22 9:21 AM Medical Oncology and Hematology [facility] [place], [place] [address] Tel. [phone]    Fax. [phone]  *Total Encounter Time as defined by the Centers for Medicare and Medicaid Services includes, in addition to the face-to-face time of a patient visit (documented in the note above) non-face-to-face time: obtaining and reviewing outside history, ordering and reviewing medications, tests or procedures, care coordination (communications with other health care professionals or caregivers) and documentation in the medical record.

## 2022-03-07 ENCOUNTER — Telehealth: Payer: Self-pay | Admitting: Adult Health

## 2022-03-07 NOTE — Telephone Encounter (Signed)
Scheduled appointment per los. Left voicemail. 

## 2022-03-13 ENCOUNTER — Telehealth: Payer: Self-pay | Admitting: *Deleted

## 2022-03-13 DIAGNOSIS — Z86010 Personal history of colonic polyps: Secondary | ICD-10-CM

## 2022-03-13 NOTE — Telephone Encounter (Signed)
Message left for patient to return my call.  

## 2022-03-13 NOTE — Telephone Encounter (Signed)
Patient left vm wanting to schedule colonoscopy.  

## 2022-03-13 NOTE — Telephone Encounter (Signed)
Patient is returning your call to scheduled a colonoscopy

## 2022-03-13 NOTE — Telephone Encounter (Signed)
Message left for patient to return my call.  Called patient to schedule colonoscopy. Last colonoscopy was on 06/11/2017

## 2022-03-15 ENCOUNTER — Other Ambulatory Visit: Payer: Self-pay | Admitting: *Deleted

## 2022-03-15 DIAGNOSIS — Z86010 Personal history of colonic polyps: Secondary | ICD-10-CM

## 2022-03-15 MED ORDER — NA SULFATE-K SULFATE-MG SULF 17.5-3.13-1.6 GM/177ML PO SOLN: 1.0000 | Freq: Once | ORAL | 0 refills | Status: AC

## 2022-03-15 NOTE — Telephone Encounter (Signed)
Gastroenterology Pre-Procedure Review  Request Date: 06/19/2022 Requesting Physician: Dr. Allen Norris  PATIENT REVIEW QUESTIONS: The patient responded to the following health history questions as indicated:    1. Are you having any GI issues? no 2. Do you have a personal history of Polyps? yes (last colonoscopy 06/11/2017) 3. Do you have a family history of Colon Cancer or Polyps? no 4. Diabetes Mellitus? no 5. Joint replacements in the past 12 months?no 6. Major health problems in the past 3 months?no 7. Any artificial heart valves, MVP, or defibrillator?no    MEDICATIONS & ALLERGIES:    Patient reports the following regarding taking any anticoagulation/antiplatelet therapy:   Plavix, Coumadin, Eliquis, Xarelto, Lovenox, Pradaxa, Brilinta, or Effient? no Aspirin? no  Patient confirms/reports the following medications:  Current Outpatient Medications  Medication Sig Dispense Refill   Na Sulfate-K Sulfate-Mg Sulf 17.5-3.13-1.6 GM/177ML SOLN Take 1 kit by mouth once for 1 dose. 354 mL 0   Ferrous Sulfate (IRON) 325 (65 Fe) MG TABS Take by mouth daily.     Multiple Vitamins-Calcium (ONE-A-DAY WOMENS PO) Take by mouth daily.     tamoxifen (NOLVADEX) 20 MG tablet Take 1 tablet (20 mg total) by mouth daily. 90 tablet 3   No current facility-administered medications for this visit.    Patient confirms/reports the following allergies:  No Known Allergies  No orders of the defined types were placed in this encounter.   AUTHORIZATION INFORMATION Primary Insurance: 1D#: Group #:  Secondary Insurance: 1D#: Group #:  SCHEDULE INFORMATION: Date: 06/19/2022 Time: Location: [facility]

## 2022-03-15 NOTE — Addendum Note (Signed)
Addended by: Jacqualin Combes on: 03/15/2022 11:36 AM   Modules accepted: Orders

## 2022-06-07 ENCOUNTER — Telehealth: Payer: Self-pay

## 2022-06-07 NOTE — Telephone Encounter (Signed)
Patient called because she got a estimate of her colonoscopy and she was not expecting the colonoscopy to be that much. She states she thought her colonoscopy was a screening colonoscopy. Informed patient no she has had polyps removed in the past so that makes the colonoscopy with most insurance plans a diagnostic colonoscopy. She states she will have to think about the procedure and will call back if she decides to cancel the procedure

## 2022-06-14 ENCOUNTER — Encounter: Payer: Self-pay | Admitting: Gastroenterology

## 2022-06-14 NOTE — Anesthesia Preprocedure Evaluation (Addendum)
Anesthesia Evaluation  Patient identified by MRN, date of birth, ID band Patient awake    Reviewed: Allergy & Precautions, H&P , NPO status , Patient's Chart, lab work & pertinent test results  Airway Mallampati: II  TM Distance: >3 FB Neck ROM: Full    Dental no notable dental hx.    Pulmonary neg pulmonary ROS   Pulmonary exam normal breath sounds clear to auscultation       Cardiovascular negative cardio ROS Normal cardiovascular exam Rhythm:Regular Rate:Normal     Neuro/Psych negative neurological ROS  negative psych ROS   GI/Hepatic negative GI ROS, Neg liver ROS,,,  Endo/Other  negative endocrine ROS    Renal/GU negative Renal ROS  negative genitourinary   Musculoskeletal negative musculoskeletal ROS (+)    Abdominal   Peds negative pediatric ROS (+)  Hematology negative hematology ROS (+)   Anesthesia Other Findings Uterine fibroid  Hemoglobin low Anemia  DCIS right Breast Cancer ([facility])     Reproductive/Obstetrics negative OB ROS                              Anesthesia Physical Anesthesia Plan  ASA: 3  Anesthesia Plan: General   Post-op Pain Management:    Induction: Intravenous  PONV Risk Score and Plan:   Airway Management Planned: Natural Airway and Nasal Cannula  Additional Equipment:   Intra-op Plan:   Post-operative Plan:   Informed Consent: I have reviewed the patients History and Physical, chart, labs and discussed the procedure including the risks, benefits and alternatives for the proposed anesthesia with the patient or authorized representative who has indicated his/her understanding and acceptance.     Dental Advisory Given  Plan Discussed with: Anesthesiologist, CRNA and Surgeon  Anesthesia Plan Comments: (Patient consented for risks of anesthesia including but not limited to:  - adverse reactions to medications - risk of airway placement if  required - damage to eyes, teeth, lips or other oral mucosa - nerve damage due to positioning  - sore throat or hoarseness - Damage to heart, brain, nerves, lungs, other parts of body or loss of life  Patient voiced understanding.)        Anesthesia Quick Evaluation

## 2022-06-19 ENCOUNTER — Ambulatory Visit: Payer: BC Managed Care – PPO | Admitting: Anesthesiology

## 2022-06-19 ENCOUNTER — Ambulatory Visit
Admission: RE | Admit: 2022-06-19 | Discharge: 2022-06-19 | Disposition: A | Discharge status: Home / Self Care | Payer: BC Managed Care – PPO | Attending: Gastroenterology | Admitting: Gastroenterology

## 2022-06-19 ENCOUNTER — Other Ambulatory Visit: Payer: Self-pay

## 2022-06-19 ENCOUNTER — Encounter
Admission: RE | Disposition: A | Discharge status: Home / Self Care | Payer: Self-pay | Source: Home / Self Care | Attending: Gastroenterology

## 2022-06-19 ENCOUNTER — Encounter: Payer: Self-pay | Admitting: Gastroenterology

## 2022-06-19 DIAGNOSIS — D649 Anemia, unspecified: Secondary | ICD-10-CM | POA: Insufficient documentation

## 2022-06-19 DIAGNOSIS — Z860100 Personal history of colon polyps, unspecified: Secondary | ICD-10-CM

## 2022-06-19 DIAGNOSIS — K640 First degree hemorrhoids: Secondary | ICD-10-CM | POA: Diagnosis not present

## 2022-06-19 DIAGNOSIS — Z1211 Encounter for screening for malignant neoplasm of colon: Secondary | ICD-10-CM | POA: Insufficient documentation

## 2022-06-19 HISTORY — PX: COLONOSCOPY WITH PROPOFOL: SHX5780

## 2022-06-19 SURGERY — COLONOSCOPY WITH PROPOFOL
Anesthesia: General | Site: Rectum

## 2022-06-19 MED ORDER — LIDOCAINE HCL (CARDIAC) PF 100 MG/5ML IV SOSY
PREFILLED_SYRINGE | INTRAVENOUS | Status: DC | PRN
  Administered 2022-06-19: 60 mg via INTRAVENOUS

## 2022-06-19 MED ORDER — LACTATED RINGERS IV SOLN: INTRAVENOUS | Status: DC

## 2022-06-19 MED ORDER — SODIUM CHLORIDE 0.9 % IV SOLN: INTRAVENOUS | Status: DC

## 2022-06-19 MED ORDER — STERILE WATER FOR IRRIGATION IR SOLN
Status: DC | PRN
  Administered 2022-06-19: 100 mL

## 2022-06-19 MED ORDER — PROPOFOL 10 MG/ML IV BOLUS
INTRAVENOUS | Status: DC | PRN
  Administered 2022-06-19: 100 mg via INTRAVENOUS
  Administered 2022-06-19: 30 mg via INTRAVENOUS

## 2022-06-19 SURGICAL SUPPLY — 6 items
GOWN CVR UNV OPN BCK APRN NK (MISCELLANEOUS) ×2 IMPLANT
GOWN ISOL THUMB LOOP REG UNIV (MISCELLANEOUS) ×2
KIT PRC NS LF DISP ENDO (KITS) ×1 IMPLANT
KIT PROCEDURE OLYMPUS (KITS) ×1
MANIFOLD NEPTUNE II (INSTRUMENTS) ×1 IMPLANT
WATER STERILE IRR 250ML POUR (IV SOLUTION) ×1 IMPLANT

## 2022-06-19 NOTE — Anesthesia Postprocedure Evaluation (Signed)
Anesthesia Post Note  Patient: Jasmine Good  Procedure(s) Performed: COLONOSCOPY WITH PROPOFOL (Rectum)  Patient location during evaluation: PACU Anesthesia Type: General Level of consciousness: awake and alert Pain management: pain level controlled Vital Signs Assessment: post-procedure vital signs reviewed and stable Respiratory status: spontaneous breathing, nonlabored ventilation, respiratory function stable and patient connected to nasal cannula oxygen Cardiovascular status: blood pressure returned to baseline and stable Postop Assessment: no apparent nausea or vomiting Anesthetic complications: no   No notable events documented.   Last Vitals:  Vitals:   06/19/22 0855 06/19/22 0859  BP: 91/62   Pulse: 66 64  Resp: 12 15  Temp:    SpO2: 99% 99%    Last Pain:  Vitals:   06/19/22 0859  TempSrc:   PainSc: 0-No pain                 Marisue Humble

## 2022-06-19 NOTE — Transfer of Care (Signed)
Immediate Anesthesia Transfer of Care Note  Patient: Jasmine Good  Procedure(s) Performed: COLONOSCOPY WITH PROPOFOL (Rectum)  Patient Location: PACU  Anesthesia Type: General  Level of Consciousness: awake, alert  and patient cooperative  Airway and Oxygen Therapy: Patient Spontanous Breathing and Patient connected to supplemental oxygen  Post-op Assessment: Post-op Vital signs reviewed, Patient's Cardiovascular Status Stable, Respiratory Function Stable, Patent Airway and No signs of Nausea or vomiting  Post-op Vital Signs: Reviewed and stable  Complications: No notable events documented.

## 2022-06-19 NOTE — H&P (Signed)
Midge Minium, MD [facility] [address], [address] [place], [place] [address] Phone:[phone] Fax : [phone]  Primary Care Physician:  Agapito Games, MD Primary Gastroenterologist:  Dr. Servando Snare  Pre-Procedure History & Physical: HPI:  Jasmine Good is a 57 y.o. female is here for an colonoscopy.   Past Medical History:  Diagnosis Date   Anemia    Breast cancer ([facility]) 09/20/2021   Cancer ([facility]) 09/12/2021   DCIS R Breast   Hemoglobin low    Uterine fibroid     Past Surgical History:  Procedure Laterality Date   BREAST LUMPECTOMY WITH RADIOACTIVE SEED LOCALIZATION Right 09/20/2021   Procedure: RIGHT BREAST LUMPECTOMY WITH RADIOACTIVE SEED LOCALIZATION;  Surgeon: Manus Rudd, MD;  Location: [facility];  Service: General;  Laterality: Right;   COLONOSCOPY WITH PROPOFOL N/A 06/11/2017   Procedure: COLONOSCOPY WITH PROPOFOL;  Surgeon: Midge Minium, MD;  [facility];  Service: Endoscopy;  Laterality: N/A;   DIAGNOSTIC LAPAROSCOPY     POLYPECTOMY  06/11/2017   Procedure: POLYPECTOMY;  Surgeon: Midge Minium, MD;  Location[facility];  Service: Endoscopy;;   UTERINE FIBROID SURGERY  08/06/2001    Prior to Admission medications   Medication Sig Start Date End Date Taking? Authorizing Provider  Ferrous Sulfate (IRON) 325 (65 Fe) MG TABS Take by mouth daily.   Yes [provider]  Multiple Vitamins-Calcium (ONE-A-DAY WOMENS PO) Take by mouth daily.   Yes [provider]  tamoxifen (NOLVADEX) 20 MG tablet Take 1 tablet (20 mg total) by mouth daily. 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 No regular caffeine intake.     Social Determinants of Health   Financial Resource Strain: Not on file  Food Insecurity: Not on file  Transportation Needs: Not on file  Physical Activity: Not on file  Stress: Not on file  Social Connections: Not on file  Intimate Partner Violence: Not At Risk (09/27/2021)   Humiliation, Afraid, Rape, and Kick questionnaire    Fear of Current or Ex-Partner: No    Emotionally Abused: No    Physically Abused: No    Sexually Abused: No    Review of Systems: See HPI, otherwise negative ROS  Physical Exam: Ht 5\' 5"  (1.651 m)   Wt 60.3 kg   LMP 02/20/2021 (Approximate)   BMI 22.13 kg/m  General:   Alert,  pleasant and cooperative in NAD Head:  Normocephalic and atraumatic. Neck:  Supple; no masses or thyromegaly. Lungs:  Clear throughout to auscultation.  Heart:  Regular rate and rhythm. Abdomen:  Soft, nontender and nondistended. Normal bowel sounds, without guarding, and without rebound.   Neurologic:  Alert and  oriented x4;  grossly normal neurologically.  Impression/Plan: Jasmine Good is here for an colonoscopy to be performed for a history of adenomatous polyps on 2019   Risks, benefits, limitations, and alternatives regarding  colonoscopy have been reviewed with the patient.  Questions have been answered.  All parties agreeable.   Midge Minium, MD  06/19/2022, 7:48 AM

## 2022-06-19 NOTE — Op Note (Signed)
[facility] Patient Name: Jasmine Good Procedure Date: 06/19/2022 8:22 AM MRN: [id number] Account #: [id number] Date of Birth: October 27, 1965 Admit Type: Outpatient Age: 57 Room: [facility] OR ROOM 01 Gender: Female Note Status: Finalized Instrument Name: [id number] Procedure:             Colonoscopy Indications:           High risk colon cancer surveillance: Personal history                         of colonic polyps Providers:             Midge Minium MD, MD Referring MD:          Nani Gasser (Referring MD) Medicines:             Propofol per Anesthesia Complications:         No immediate complications. Procedure:             Pre-Anesthesia Assessment:                        - Prior to the procedure, a History and Physical was                         performed, and patient medications and allergies were                         reviewed. The patient's tolerance of previous                         anesthesia was also reviewed. The risks and benefits                         of the procedure and the sedation options and risks                         were discussed with the patient. All questions were                         answered, and informed consent was obtained. Prior                         Anticoagulants: The patient has taken no anticoagulant                         or antiplatelet agents. ASA Grade Assessment: II - A                         patient with mild systemic disease. After reviewing                         the risks and benefits, the patient was deemed in                         satisfactory condition to undergo the procedure.                        After obtaining informed consent, the colonoscope was  passed under direct vision. Throughout the procedure,                         the patient's blood pressure, pulse, and oxygen                         saturations were monitored continuously. The                          Colonoscope was introduced through the anus and                         advanced to the the cecum, identified by appendiceal                         orifice and ileocecal valve. The colonoscopy was                         performed without difficulty. The patient tolerated                         the procedure well. The quality of the bowel                         preparation was excellent. Findings:      The perianal and digital rectal examinations were normal.      Non-bleeding internal hemorrhoids were found during retroflexion. The       hemorrhoids were Grade I (internal hemorrhoids that do not prolapse).      The exam was otherwise without abnormality. Impression:            - Non-bleeding internal hemorrhoids.                        - The examination was otherwise normal.                        - No specimens collected. Recommendation:        - Discharge patient to home.                        - Resume previous diet.                        - Continue present medications.                        - Repeat colonoscopy in 7 years for surveillance. Procedure Code(s):     --- Professional ---                        [id number], Colonoscopy, flexible; diagnostic, including                         collection of specimen(s) by brushing or washing, when                         performed (separate procedure) Diagnosis Code(s):     --- Professional ---  Z86.010, Personal history of colonic polyps CPT copyright 2022 American Medical Association. All rights reserved. The codes documented in this report are preliminary and upon coder review may  be revised to meet current compliance requirements. Midge Minium MD, MD 06/19/2022 8:50:33 AM This report has been signed electronically. Number of Addenda: 0 Note Initiated On: 06/19/2022 8:22 AM Scope Withdrawal Time: 0 hours 10 minutes 39 seconds  Total Procedure Duration: 0 hours 17 minutes 17 seconds  Estimated Blood Loss:   Estimated blood loss: none.      [facility]

## 2022-06-20 ENCOUNTER — Encounter: Payer: Self-pay | Admitting: Gastroenterology

## 2022-08-31 ENCOUNTER — Inpatient Hospital Stay: Payer: BC Managed Care – PPO | Attending: Hematology and Oncology | Admitting: Hematology and Oncology

## 2022-08-31 ENCOUNTER — Other Ambulatory Visit: Payer: Self-pay

## 2022-08-31 VITALS — BP 107/67 | HR 75 | Temp 97.9°F | Resp 18 | Ht 65.0 in | Wt 140.7 lb

## 2022-08-31 DIAGNOSIS — D0511 Intraductal carcinoma in situ of right breast: Secondary | ICD-10-CM | POA: Insufficient documentation

## 2022-08-31 DIAGNOSIS — Z79810 Long term (current) use of selective estrogen receptor modulators (SERMs): Secondary | ICD-10-CM | POA: Diagnosis not present

## 2022-08-31 NOTE — Assessment & Plan Note (Signed)
This is a very pleasant 57 year old female patient with right breast DCIS status postlumpectomy, ER/PR positive referred to medical oncology for adjuvant antiestrogen therapy recommendations.  She is now status post adjuvant radiation and is on Tamoxifen. She is tolerating tamoxifen extremely well except for some hot flashes.  She denies any other symptoms today. On brief physical exam, right breast with no concern this.  She had excellent outcome after surgery and radiation.  She has her mammogram scheduled for tomorrow.  We have once again discussed about the adverse effects from tamoxifen which include but not limited to DVT/PE, symptoms and signs of DVT/PE, endometrial hyperplasia, rarely endometrial cancer.  She was instructed to call us with any symptoms or unexpected vaginal bleeding etc. and she expressed understanding.   Plan is to proceed with 5 years of antiestrogen therapy.  Return to clinic in 6 months. Thank you for consulting [place] in the care of this patient.   Please do not hesitate to contact us with any additional questions or concerns.

## 2022-08-31 NOTE — Progress Notes (Signed)
[facility] CONSULT NOTE  Patient Care Team: Agapito Games, MD as PCP - Almyra Deforest, MD as Consulting Physician (Hematology and Oncology) Manus Rudd, MD as Consulting Physician (General Surgery) Dorothy Puffer, MD as Consulting Physician (Radiation Oncology)  CHIEF COMPLAINTS/PURPOSE OF CONSULTATION:  Newly diagnosed breast cancer  HISTORY OF PRESENTING ILLNESS:  Jasmine Good 57 y.o. female is here because of recent diagnosis of right breast DCIS  I reviewed her records extensively and collaborated the history with the patient.  SUMMARY OF ONCOLOGIC HISTORY: Oncology History  Ductal carcinoma in situ (DCIS) of right breast  08/30/2021 Mammogram   Diagnostic mammogram showed indeterminate linearly oriented coarse heterogeneous calcifications just lateral to the biopsy marking clip within the posterior aspect of the right breast.  X biopsy marking clip is located 0.6 cm inferior to residual calcifications.   09/20/2021 Pathology Results   Right breast lumpectomy showed DCIS, intermediate grade with calcifications discontinuously involving a fibrotic area of about 1.5 cm, no evidence of invasive carcinoma.  Resection margins are negative for DCIS, closest is a inferior margin of 0.3 cm.  Prognostics on the biopsy showed 95% positive strong staining for estrogen receptor, 90% positive strong staining for progesterone receptor.   09/20/2021 Definitive Surgery   Right breast lumpectomy with DCIS, intermediate grade with calcifications, discontinuously involving a fibrotic area of about 1.5 cm.  No evidence of invasive carcinoma.  Resection margins are negative for DCIS, closest is the inferior margin at 0.3 cm.  Prior prognostic showed ER 95% positive strong staining PR 90% positive strong staining   10/04/2021 Cancer Staging   Staging form: Breast, AJCC 8th Edition - Clinical: Stage 0 (cTis (DCIS), cN0, cM0, G2, ER+, PR+) - Signed by Rachel Moulds, MD on  10/04/2021 Histologic grading system: 3 grade system   10/24/2021 - 11/18/2021 Radiation Therapy   Site Technique Total Dose (Gy) Dose per Fx (Gy) Completed Fx Beam Energies  Breast, Right: Breast_R 3D 42.56/42.56 2.66 16/16 6XFFF  Breast, Right: Breast_R_Bst specialPort 8/8 2 4/4 9E, 12E     12/2021 -  Anti-estrogen oral therapy   Tamoxifen    Interval history  Jasmine Good is here for follow-up while on tamoxifen. She was thought to be premenopausal since her menstrual cycle was in the early part of the year.  She is tolerating tamoxifen very well except for some hot flashes which are overall manageable.  She denies any other side effects.  She has a mammogram scheduled for tomorrow.  No lower extremity swelling, chest pain or shortness of breath.  No change in vision.  Rest of the pertinent 10 point ROS reviewed and negative   MEDICAL HISTORY:  Past Medical History:  Diagnosis Date   Anemia    Breast cancer (HCC) 09/20/2021   Cancer (HCC) 09/12/2021   DCIS R Breast   Hemoglobin low    Uterine fibroid     SURGICAL HISTORY: Past Surgical History:  Procedure Laterality Date   BREAST LUMPECTOMY WITH RADIOACTIVE SEED LOCALIZATION Right 09/20/2021   Procedure: RIGHT BREAST LUMPECTOMY WITH RADIOACTIVE SEED LOCALIZATION;  Surgeon: Manus Rudd, MD;  Location: [facility];  Service: General;  Laterality: Right;   COLONOSCOPY WITH PROPOFOL N/A 06/11/2017   Procedure: COLONOSCOPY WITH PROPOFOL;  Surgeon: Midge Minium, MD;  Location: [facility];  Service: Endoscopy;  Laterality: N/A;   COLONOSCOPY WITH PROPOFOL N/A 06/19/2022   Procedure: COLONOSCOPY WITH PROPOFOL;  Surgeon: Midge Minium, MD;  Location: [facility];  Service: Endoscopy;  Laterality: N/A;   DIAGNOSTIC LAPAROSCOPY     POLYPECTOMY  06/11/2017   Procedure: POLYPECTOMY;  Surgeon: Midge Minium, MD;  Location: [facility];  Service: Endoscopy;;   UTERINE FIBROID SURGERY  08/06/2001     SOCIAL HISTORY: Social History   Socioeconomic History   Marital status: Single    Spouse name: Not on file   Number of children: 0   Years of education: Not on file   Highest education level: Not on file  Occupational History    Comment: [employer]  Tobacco Use   Smoking status: Never   Smokeless tobacco: Never  Vaping Use   Vaping status: Never Used  Substance and Sexual Activity   Alcohol use: No    Alcohol/week: 0.0 standard drinks of alcohol   Drug use: No   Sexual activity: Not Currently  Other Topics Concern   Not on file  Social History Narrative   regular exercise once every 2 weeks. No regular caffeine intake.     Social Determinants of Health   Financial Resource Strain: Not on file  Food Insecurity: Not on file  Transportation Needs: Not on file  Physical Activity: Not on file  Stress: Not on file  Social Connections: Not on file  Intimate Partner Violence: Not At Risk (09/27/2021)   Humiliation, Afraid, Rape, and Kick questionnaire    Fear of Current or Ex-Partner: No    Emotionally Abused: No    Physically Abused: No    Sexually Abused: No    FAMILY HISTORY: Family History  Problem Relation Age of Onset   Heart failure Mother    Diabetes Father 65   Healthy Sister    Healthy Brother     ALLERGIES:  has No Known Allergies.  MEDICATIONS:  Current Outpatient Medications  Medication Sig Dispense Refill   Ferrous Sulfate (IRON) 325 (65 Fe) MG TABS Take by mouth daily.     Multiple Vitamins-Calcium (ONE-A-DAY WOMENS PO) Take by mouth daily.     tamoxifen (NOLVADEX) 20 MG tablet Take 1 tablet (20 mg total) by mouth daily. 90 tablet 3   No current facility-administered medications for this visit.   PHYSICAL EXAMINATION: ECOG PERFORMANCE STATUS: 0 - Asymptomatic  Vitals:   08/31/22 0911  BP: 107/67  Pulse: 75  Resp: 18  Temp: 97.9 F (36.6 C)  SpO2: 100%    Filed Weights   08/31/22 0911  Weight: 140 lb 11.2 oz (63.8 kg)      GENERAL:alert, no distress and comfortable Breast exam.  Right breast status postlumpectomy, recovered very well from surgery and radiation with excellent cosmetic outcome.  No breast masses noted.  No regional adenopathy.  Left breast normal to inspection and palpation.  No lower extremity swelling. Chest: Clear to auscultation bilaterally  LABORATORY DATA:  I have reviewed the data as listed Lab Results  Component Value Date   WBC 4.1 06/15/2021   HGB 12.5 06/15/2021   HCT 37.4 06/15/2021   MCV 91.9 06/15/2021   PLT 288 06/15/2021   Lab Results  Component Value Date   NA 139 06/15/2021   K 4.8 06/15/2021   CL 103 06/15/2021   CO2 26 06/15/2021    RADIOGRAPHIC STUDIES: I have personally reviewed the radiological reports and agreed with the findings in the report.  ASSESSMENT AND PLAN:   Ductal carcinoma in situ (DCIS) of right breast This is a very pleasant 57 year old female patient with right breast DCIS status postlumpectomy, ER/PR positive referred to medical oncology for  adjuvant antiestrogen therapy recommendations.  She is now status post adjuvant radiation and is on Tamoxifen. She is tolerating tamoxifen extremely well except for some hot flashes.  She denies any other symptoms today. On brief physical exam, right breast with no concern this.  She had excellent outcome after surgery and radiation.  She has her mammogram scheduled for tomorrow.  We have once again discussed about the adverse effects from tamoxifen which include but not limited to DVT/PE, symptoms and signs of DVT/PE, endometrial hyperplasia, rarely endometrial cancer.  She was instructed to call us with any symptoms or unexpected vaginal bleeding etc. and she expressed understanding.   Plan is to proceed with 5 years of antiestrogen therapy.  Return to clinic in 6 months. Thank you for consulting [place] in the care of this patient.   Please do not hesitate to contact us with any additional questions or  concerns.   Total time spent: 30 minutes including history, physical, review of records, counseling and coordination of care All questions were answered. The patient knows to call the clinic with any problems, questions or concerns.    Rachel Moulds, MD 08/31/22

## 2022-09-01 ENCOUNTER — Ambulatory Visit
Admission: RE | Admit: 2022-09-01 | Discharge: 2022-09-01 | Disposition: A | Discharge status: Home / Self Care | Payer: BC Managed Care – PPO | Source: Ambulatory Visit | Attending: Adult Health | Admitting: Adult Health

## 2022-09-01 DIAGNOSIS — D0511 Intraductal carcinoma in situ of right breast: Secondary | ICD-10-CM

## 2022-09-01 DIAGNOSIS — Z9889 Other specified postprocedural states: Secondary | ICD-10-CM | POA: Diagnosis not present

## 2022-09-01 HISTORY — DX: Personal history of irradiation: Z92.3

## 2022-09-15 ENCOUNTER — Encounter: Payer: Self-pay | Admitting: Family Medicine

## 2022-09-15 ENCOUNTER — Ambulatory Visit (INDEPENDENT_AMBULATORY_CARE_PROVIDER_SITE_OTHER): Payer: BC Managed Care – PPO | Admitting: Family Medicine

## 2022-09-15 VITALS — BP 102/62 | HR 66 | Ht 65.0 in | Wt 141.0 lb

## 2022-09-15 DIAGNOSIS — D500 Iron deficiency anemia secondary to blood loss (chronic): Secondary | ICD-10-CM | POA: Diagnosis not present

## 2022-09-15 DIAGNOSIS — R7301 Impaired fasting glucose: Secondary | ICD-10-CM | POA: Diagnosis not present

## 2022-09-15 DIAGNOSIS — Z0000 Encounter for general adult medical examination without abnormal findings: Secondary | ICD-10-CM | POA: Diagnosis not present

## 2022-09-15 DIAGNOSIS — Z23 Encounter for immunization: Secondary | ICD-10-CM

## 2022-09-15 NOTE — Progress Notes (Signed)
Complete physical exam  Patient: Jasmine Good   DOB: February 01, 1966   57 y.o. Female  MRN: [id number]  Subjective:    Chief Complaint  Patient presents with   Annual Exam    CRISSA LINDT is a 57 y.o. female who presents today for a complete physical exam. She reports consuming a general diet. Home exercise routine includes walking for 30 min at a time and resistance training. She generally feels well. . She does not have additional problems to discuss today.   Hx of Right BrCa. Mammo done this summer.   Most recent fall risk assessment:    09/15/2022    1:51 PM  Fall Risk   Falls in the past year? 1  Number falls in past yr: 0  Injury with Fall? 0  Risk for fall due to : No Fall Risks  Follow up Falls evaluation completed     Most recent depression screenings:    09/15/2022    1:51 PM 06/15/2021   11:19 AM  PHQ 2/9 Scores  PHQ - 2 Score 0 0        Patient Care Team: Agapito Games, MD as PCP - Almyra Deforest, MD as Consulting Physician (Hematology and Oncology) Manus Rudd, MD as Consulting Physician (General Surgery) Dorothy Puffer, MD as Consulting Physician (Radiation Oncology)   Outpatient Medications Prior to Visit  Medication Sig   Ferrous Sulfate (IRON) 325 (65 Fe) MG TABS Take by mouth daily.   Multiple Vitamins-Calcium (ONE-A-DAY WOMENS PO) Take by mouth daily.   tamoxifen (NOLVADEX) 20 MG tablet Take 1 tablet (20 mg total) by mouth daily.   No facility-administered medications prior to visit.    ROS        Objective:     BP 102/62   Pulse 66   Ht 5\' 5"  (1.651 m)   Wt 141 lb (64 kg)   LMP 02/20/2021 (Approximate)   SpO2 100%   BMI 23.46 kg/m    Physical Exam Vitals and nursing note reviewed.  Constitutional:      Appearance: Normal appearance. She is well-developed.  HENT:     Head: Normocephalic and atraumatic.     Right Ear: Tympanic membrane, ear canal and external ear normal.     Left Ear: Tympanic membrane, ear  canal and external ear normal.     Nose: Nose normal.     Mouth/Throat:     Pharynx: Oropharynx is clear.  Eyes:     Conjunctiva/sclera: Conjunctivae normal.     Pupils: Pupils are equal, round, and reactive to light.  Neck:     Thyroid: No thyromegaly.  Cardiovascular:     Rate and Rhythm: Normal rate and regular rhythm.     Heart sounds: Normal heart sounds.  Pulmonary:     Effort: Pulmonary effort is normal.     Breath sounds: Normal breath sounds. No wheezing.  Abdominal:     General: Bowel sounds are normal.     Palpations: Abdomen is soft.  Musculoskeletal:     Cervical back: Neck supple.  Lymphadenopathy:     Cervical: No cervical adenopathy.  Skin:    General: Skin is warm and dry.     Coloration: Skin is not pale.  Neurological:     Mental Status: She is alert and oriented to person, place, and time.  Psychiatric:        Mood and Affect: Mood normal.        Behavior: Behavior normal.  No results found for any visits on 09/15/22.     Assessment & Plan:    Routine Health Maintenance and Physical Exam  Immunization History  Administered Date(s) Administered   Influenza Split 11/10/2010   Influenza,inj,Quad PF,6+ Mos 01/06/2013, 03/02/2015, 03/01/2016, 11/19/2017, 12/25/2019   Janssen (J&J) SARS-COV-2 Vaccination 04/20/2019   Td 07/17/2007   Tdap 11/19/2017   Zoster Recombinant(Shingrix) 12/27/2017, 02/27/2018    Health Maintenance  Topic Date Due   INFLUENZA VACCINE  09/07/2022   PAP SMEAR-Modifier  06/16/2024   MAMMOGRAM  08/31/2024   DTaP/Tdap/Td (3 - Td or Tdap) 11/20/2027   Colonoscopy  06/18/2029   Hepatitis C Screening  Completed   HIV Screening  Completed   Zoster Vaccines- Shingrix  Completed   Pneumococcal Vaccine 41-78 Years old  Aged Out   HPV VACCINES  Aged Out   COVID-19 Vaccine  Discontinued    Discussed health benefits of physical activity, and encouraged her to engage in regular exercise appropriate for her age and  condition.  Problem List Items Addressed This Visit       Endocrine   IFG (impaired fasting glucose)   Relevant Orders   CMP14+EGFR   Lipid panel   Hemoglobin A1c   Fe+TIBC+Fer     Other   Iron deficiency anemia   Relevant Orders   CMP14+EGFR   Lipid panel   Hemoglobin A1c   Fe+TIBC+Fer   Other Visit Diagnoses     Wellness examination    -  Primary   Relevant Orders   CMP14+EGFR   Lipid panel   Hemoglobin A1c   Fe+TIBC+Fer      Return in about 1 year (around 09/15/2023) for Wellness Exam.   Keep up a regular exercise program and make sure you are eating a healthy diet Try to eat 4 servings of dairy a day, or if you are lactose intolerant take a calcium with vitamin D daily.  Your vaccines are up to date.  Will be on tamoxifen for 5 years.   Nani Gasser, MD

## 2022-09-16 ENCOUNTER — Encounter: Payer: Self-pay | Admitting: Family Medicine

## 2022-09-18 NOTE — Progress Notes (Signed)
Hi Jasmine Good, kidney and liver function look good.  A1c was 6.4 this time which is up from previous of 5.7.  Remember 6.5 is full-blown diabetes.  She is just really encourage you to work on healthy food choices and exercising.  I would like to recheck your A1c again in about 4 months instead of waiting 6 months.  Cholesterol looks great.  Iron levels actually look really great!

## 2022-11-18 ENCOUNTER — Other Ambulatory Visit: Payer: Self-pay | Admitting: Hematology and Oncology

## 2022-12-18 ENCOUNTER — Ambulatory Visit: Payer: BC Managed Care – PPO | Admitting: Family Medicine

## 2022-12-18 ENCOUNTER — Encounter: Payer: Self-pay | Admitting: Family Medicine

## 2022-12-18 VITALS — BP 95/51 | HR 66 | Ht 65.0 in | Wt 136.0 lb

## 2022-12-18 DIAGNOSIS — R7301 Impaired fasting glucose: Secondary | ICD-10-CM

## 2022-12-18 LAB — POCT GLYCOSYLATED HEMOGLOBIN (HGB A1C): Hemoglobin A1C: 5.6 % (ref 4.0–5.6)

## 2022-12-18 NOTE — Progress Notes (Signed)
   Established Patient Office Visit  Subjective   Patient ID: Jasmine Good, female    DOB: 07-12-1965  Age: 57 y.o. MRN: [id number]  Chief Complaint  Patient presents with   ifg    HPI  Impaired fasting glucose-no increased thirst or urination. No symptoms consistent with hypoglycemia.  Did really change up her diet and has been exercising little bit more consistently.  Doing really well overall.  Still on the tamoxifen.  No recent changes or concerns or complaints.  Eye exam was done in April.     ROS    Objective:     BP (!) 95/51   Pulse 66   Ht 5\' 5"  (1.651 m)   Wt 136 lb (61.7 kg)   LMP 02/20/2021 (Approximate)   SpO2 99%   BMI 22.63 kg/m    Physical Exam Vitals and nursing note reviewed.  Constitutional:      Appearance: Normal appearance.  HENT:     Head: Normocephalic and atraumatic.  Eyes:     Conjunctiva/sclera: Conjunctivae normal.  Cardiovascular:     Rate and Rhythm: Normal rate and regular rhythm.  Pulmonary:     Effort: Pulmonary effort is normal.     Breath sounds: Normal breath sounds.  Skin:    General: Skin is warm and dry.  Neurological:     Mental Status: She is alert.  Psychiatric:        Mood and Affect: Mood normal.      Results for orders placed or performed in visit on 12/18/22  POCT HgB A1C  Result Value Ref Range   Hemoglobin A1C 5.6 4.0 - 5.6 %   HbA1c POC (<> result, manual entry)     HbA1c, POC (prediabetic range)     HbA1c, POC (controlled diabetic range)        The 10-year ASCVD risk score (Arnett DK, et al., 2019) is: 1%    Assessment & Plan:   Problem List Items Addressed This Visit       Endocrine   IFG (impaired fasting glucose) - Primary    Much improved today!! F/U in 6 months. Doing great!!!!!       Relevant Orders   POCT HgB A1C (Completed)    Return in about 6 months (around 06/17/2023) for Pre-diabetes.    Nani Gasser, MD

## 2022-12-18 NOTE — Assessment & Plan Note (Signed)
Much improved today!! F/U in 6 months. Doing great!!!!!

## 2023-03-02 ENCOUNTER — Inpatient Hospital Stay: Payer: BC Managed Care – PPO | Attending: Hematology and Oncology | Admitting: Hematology and Oncology

## 2023-03-02 DIAGNOSIS — D0511 Intraductal carcinoma in situ of right breast: Secondary | ICD-10-CM | POA: Diagnosis not present

## 2023-03-02 DIAGNOSIS — Z1721 Progesterone receptor positive status: Secondary | ICD-10-CM | POA: Diagnosis not present

## 2023-03-02 DIAGNOSIS — Z79810 Long term (current) use of selective estrogen receptor modulators (SERMs): Secondary | ICD-10-CM | POA: Diagnosis not present

## 2023-03-02 DIAGNOSIS — Z170 Estrogen receptor positive status [ER+]: Secondary | ICD-10-CM | POA: Diagnosis not present

## 2023-03-02 DIAGNOSIS — R7303 Prediabetes: Secondary | ICD-10-CM | POA: Diagnosis not present

## 2023-03-02 NOTE — Progress Notes (Signed)
[facility] CONSULT NOTE  Patient Care Team: Agapito Games, MD as PCP - Almyra Deforest, MD as Consulting Physician (Hematology and Oncology) Manus Rudd, MD as Consulting Physician (General Surgery) Dorothy Puffer, MD as Consulting Physician (Radiation Oncology)  CHIEF COMPLAINTS/PURPOSE OF CONSULTATION:  Newly diagnosed breast cancer  HISTORY OF PRESENTING ILLNESS:  Jasmine Good 58 y.o. female is here because of recent diagnosis of right breast DCIS  I reviewed her records extensively and collaborated the history with the patient.  SUMMARY OF ONCOLOGIC HISTORY: Oncology History  Ductal carcinoma in situ (DCIS) of right breast  08/30/2021 Mammogram   Diagnostic mammogram showed indeterminate linearly oriented coarse heterogeneous calcifications just lateral to the biopsy marking clip within the posterior aspect of the right breast.  X biopsy marking clip is located 0.6 cm inferior to residual calcifications.   09/20/2021 Pathology Results   Right breast lumpectomy showed DCIS, intermediate grade with calcifications discontinuously involving a fibrotic area of about 1.5 cm, no evidence of invasive carcinoma.  Resection margins are negative for DCIS, closest is a inferior margin of 0.3 cm.  Prognostics on the biopsy showed 95% positive strong staining for estrogen receptor, 90% positive strong staining for progesterone receptor.   09/20/2021 Definitive Surgery   Right breast lumpectomy with DCIS, intermediate grade with calcifications, discontinuously involving a fibrotic area of about 1.5 cm.  No evidence of invasive carcinoma.  Resection margins are negative for DCIS, closest is the inferior margin at 0.3 cm.  Prior prognostic showed ER 95% positive strong staining PR 90% positive strong staining   10/04/2021 Cancer Staging   Staging form: Breast, AJCC 8th Edition - Clinical: Stage 0 (cTis (DCIS), cN0, cM0, G2, ER+, PR+) - Signed by Rachel Moulds, MD on  10/04/2021 Histologic grading system: 3 grade system   10/24/2021 - 11/18/2021 Radiation Therapy   Site Technique Total Dose (Gy) Dose per Fx (Gy) Completed Fx Beam Energies  Breast, Right: Breast_R 3D 42.56/42.56 2.66 16/16 6XFFF  Breast, Right: Breast_R_Bst specialPort 8/8 2 4/4 9E, 12E     12/2021 -  Anti-estrogen oral therapy   Tamoxifen    Interval history  The patient, with a history of breast cancer, presents for a routine follow-up. She reports compliance with tamoxifen and has noticed a significant improvement in hot flashes, which were previously bothersome, particularly at night. She denies any vaginal bleeding.  In addition to the breast cancer, the patient has been managing her blood glucose levels through diet and exercise after a recent diagnosis of prediabetes. She reports a decrease in her hemoglobin A1c from 6.5 to 5.6, which is encouraging.  The patient also mentions an upcoming birthday and plans to celebrate with a concert and dinner out.  Rest of the pertinent 10 point ROS reviewed and negative   MEDICAL HISTORY:  Past Medical History:  Diagnosis Date   Anemia    Breast cancer (HCC) 09/20/2021   Cancer (HCC) 09/12/2021   DCIS R Breast   Hemoglobin low    Personal history of radiation therapy    Uterine fibroid     SURGICAL HISTORY: Past Surgical History:  Procedure Laterality Date   BREAST LUMPECTOMY     BREAST LUMPECTOMY WITH RADIOACTIVE SEED LOCALIZATION Right 09/20/2021   Procedure: RIGHT BREAST LUMPECTOMY WITH RADIOACTIVE SEED LOCALIZATION;  Surgeon: Manus Rudd, MD;  Location: [facility];  Service: General;  Laterality: Right;   COLONOSCOPY WITH PROPOFOL N/A 06/11/2017   Procedure: COLONOSCOPY WITH PROPOFOL;  Surgeon: Midge Minium,  MD;  Location: [facility];  Service: Endoscopy;  Laterality: N/A;   COLONOSCOPY WITH PROPOFOL N/A 06/19/2022   Procedure: COLONOSCOPY WITH PROPOFOL;  Surgeon: Midge Minium, MD;  Location:  [facility];  Service: Endoscopy;  Laterality: N/A;   DIAGNOSTIC LAPAROSCOPY     POLYPECTOMY  06/11/2017   Procedure: POLYPECTOMY;  Surgeon: Midge Minium, MD;  Location: [facility];  Service: Endoscopy;;   UTERINE FIBROID SURGERY  08/06/2001    SOCIAL HISTORY: Social History   Socioeconomic History   Marital status: Single    Spouse name: Not on file   Number of children: 0   Years of education: Not on file   Highest education level: Not on file  Occupational History    Comment: [employer]  Tobacco Use   Smoking status: Never   Smokeless tobacco: Never  Vaping Use   Vaping status: Never Used  Substance and Sexual Activity   Alcohol use: No    Alcohol/week: 0.0 standard drinks of alcohol   Drug use: No   Sexual activity: Not Currently  Other Topics Concern   Not on file  Social History Narrative   regular exercise once every 2 weeks. No regular caffeine intake.     Social Drivers of Corporate investment banker Strain: Not on file  Food Insecurity: Not on file  Transportation Needs: Not on file  Physical Activity: Sufficiently Active (09/15/2022)   Exercise Vital Sign    Days of Exercise per Week: 4 days    Minutes of Exercise per Session: 50 min  Stress: Not on file  Social Connections: Not on file  Intimate Partner Violence: Not At Risk (09/27/2021)   Humiliation, Afraid, Rape, and Kick questionnaire    Fear of Current or Ex-Partner: No    Emotionally Abused: No    Physically Abused: No    Sexually Abused: No    FAMILY HISTORY: Family History  Problem Relation Age of Onset   Heart failure Mother    Diabetes Father 12   Healthy Sister    Healthy Brother     ALLERGIES:  has no known allergies.  MEDICATIONS:  Current Outpatient Medications  Medication Sig Dispense Refill   Ferrous Sulfate (IRON) 325 (65 Fe) MG TABS Take by mouth daily.     Multiple Vitamins-Calcium (ONE-A-DAY WOMENS PO) Take by mouth daily.     tamoxifen (NOLVADEX)  20 MG tablet TAKE 1 TABLET BY MOUTH EVERY DAY 90 tablet 3   No current facility-administered medications for this visit.   PHYSICAL EXAMINATION: ECOG PERFORMANCE STATUS: 0 - Asymptomatic  Vitals:   03/02/23 0932 03/02/23 0933  BP: (!) 96/50 (!) 90/53  Pulse: 83   Resp: 18   Temp: (!) 97.5 F (36.4 C)   SpO2: 100%     Filed Weights   03/02/23 0932  Weight: 135 lb 3.2 oz (61.3 kg)    GENERAL:alert, no distress and comfortable Breast exam.  Right breast status postlumpectomy, recovered very well from surgery and radiation with excellent cosmetic outcome.  No breast masses noted.  No regional adenopathy.  Left breast normal to inspection and palpation.  No lower extremity swelling. Chest: Clear to auscultation bilaterally  LABORATORY DATA:  I have reviewed the data as listed Lab Results  Component Value Date   WBC 4.1 06/15/2021   HGB 12.5 06/15/2021   HCT 37.4 06/15/2021   MCV 91.9 06/15/2021   PLT 288 06/15/2021   Lab Results  Component Value Date   NA 139  09/15/2022   K 4.2 09/15/2022   CL 105 09/15/2022   CO2 22 09/15/2022    RADIOGRAPHIC STUDIES: I have personally reviewed the radiological reports and agreed with the findings in the report.  ASSESSMENT AND PLAN:   Ductal carcinoma in situ (DCIS) of right breast This is a very pleasant 58 year old female patient with right breast DCIS status postlumpectomy, ER/PR positive referred to medical oncology for adjuvant antiestrogen therapy recommendations.  She is now status post adjuvant radiation and is on Tamoxifen. She is tolerating tamoxifen extremely well except for some hot flashes.  She denies any other symptoms today.  Breast Cancer On Tamoxifen with improved hot flashes. No vaginal bleeding or blood clots reported. Normal physical exam. -Continue Tamoxifen and monitor for side effects. -Order mammogram for July 2025.  Prediabetes Improved HbA1c from lifestyle modifications including increased exercise  and improved diet. -Continue lifestyle modifications. -Continue monitoring HbA1c levels.  General Health Maintenance -Return for follow-up in six months.  Total time spent: 30 minutes including history, physical, review of records, counseling and coordination of care All questions were answered. The patient knows to call the clinic with any problems, questions or concerns.    Rachel Moulds, MD 03/02/23

## 2023-03-02 NOTE — Assessment & Plan Note (Addendum)
This is a very pleasant 58 year old female patient with right breast DCIS status postlumpectomy, ER/PR positive referred to medical oncology for adjuvant antiestrogen therapy recommendations.  She is now status post adjuvant radiation and is on Tamoxifen. She is tolerating tamoxifen extremely well except for some hot flashes.  She denies any other symptoms today.  Breast Cancer On Tamoxifen with improved hot flashes. No vaginal bleeding or blood clots reported. Normal physical exam. -Continue Tamoxifen and monitor for side effects. -Order mammogram for July 2025.  Prediabetes Improved HbA1c from lifestyle modifications including increased exercise and improved diet. -Continue lifestyle modifications. -Continue monitoring HbA1c levels.  General Health Maintenance -Return for follow-up in six months.

## 2023-03-03 ENCOUNTER — Telehealth: Payer: Self-pay | Admitting: Hematology and Oncology

## 2023-03-03 NOTE — Telephone Encounter (Signed)
Left patient a vm regarding upcoming appointment

## 2023-06-18 ENCOUNTER — Ambulatory Visit: Payer: BC Managed Care – PPO | Admitting: Family Medicine

## 2023-06-18 ENCOUNTER — Encounter: Payer: Self-pay | Admitting: Family Medicine

## 2023-06-18 VITALS — BP 99/56 | HR 66 | Ht 65.0 in | Wt 135.0 lb

## 2023-06-18 DIAGNOSIS — D500 Iron deficiency anemia secondary to blood loss (chronic): Secondary | ICD-10-CM | POA: Diagnosis not present

## 2023-06-18 DIAGNOSIS — D0511 Intraductal carcinoma in situ of right breast: Secondary | ICD-10-CM

## 2023-06-18 DIAGNOSIS — R7301 Impaired fasting glucose: Secondary | ICD-10-CM

## 2023-06-18 LAB — POCT GLYCOSYLATED HEMOGLOBIN (HGB A1C): Hemoglobin A1C: 5.5 % (ref 4.0–5.6)

## 2023-06-18 NOTE — Assessment & Plan Note (Signed)
 Still taking her iron.

## 2023-06-18 NOTE — Assessment & Plan Note (Signed)
 ON tamoxifen . Tolerating well.

## 2023-06-18 NOTE — Assessment & Plan Note (Signed)
 Awesome at 5.5. Go back to yearly testing. Has been exercsing

## 2023-06-18 NOTE — Progress Notes (Signed)
   Established Patient Office Visit  Subjective  Patient ID: Jasmine Good, female    DOB: 1966/01/31  Age: 58 y.o. MRN: [id number]  Chief Complaint  Patient presents with   Medical Management of Chronic Issues    IFG (impaired fasting glucose)     HPI 6 mo f/u: Impaired fasting glucose-no increased thirst or urination. No symptoms consistent with hypoglycemia.     ROS    Objective:     BP (!) 99/56   Pulse 66   Ht 5\' 5"  (1.651 m)   Wt 135 lb (61.2 kg)   LMP 02/20/2021 (Approximate)   SpO2 99%   BMI 22.47 kg/m    Physical Exam Vitals and nursing note reviewed.  Constitutional:      Appearance: Normal appearance.  HENT:     Head: Normocephalic and atraumatic.  Eyes:     Conjunctiva/sclera: Conjunctivae normal.  Cardiovascular:     Rate and Rhythm: Normal rate and regular rhythm.  Pulmonary:     Effort: Pulmonary effort is normal.     Breath sounds: Normal breath sounds.  Skin:    General: Skin is warm and dry.  Neurological:     Mental Status: She is alert.  Psychiatric:        Mood and Affect: Mood normal.     Results for orders placed or performed in visit on 06/18/23  POCT HgB A1C  Result Value Ref Range   Hemoglobin A1C 5.5 4.0 - 5.6 %   HbA1c POC (<> result, manual entry)     HbA1c, POC (prediabetic range)     HbA1c, POC (controlled diabetic range)        The 10-year ASCVD risk score (Arnett DK, et al., 2019) is: 1.3%    Assessment & Plan:   Problem List Items Addressed This Visit       Endocrine   IFG (impaired fasting glucose) - Primary   Awesome at 5.5. Go back to yearly testing. Has been exercsing      Relevant Orders   POCT HgB A1C (Completed)     Other   Iron deficiency anemia   Still taking her iron.       Ductal carcinoma in situ (DCIS) of right breast   ON tamoxifen . Tolerating well.       Planning on scheduling her physical in August with labs.    Pap due in 06/2024.    Will get labs in Aguust.         Return if symptoms worsen or fail to improve.    Duaine German, MD

## 2023-07-19 ENCOUNTER — Other Ambulatory Visit: Payer: Self-pay | Admitting: Family Medicine

## 2023-07-19 DIAGNOSIS — Z9889 Other specified postprocedural states: Secondary | ICD-10-CM

## 2023-08-30 ENCOUNTER — Telehealth: Payer: Self-pay | Admitting: *Deleted

## 2023-08-30 NOTE — Telephone Encounter (Signed)
 LVM for pt to confirm appt 08/31/23.

## 2023-08-31 ENCOUNTER — Inpatient Hospital Stay: Payer: BC Managed Care – PPO | Attending: Hematology and Oncology | Admitting: Hematology and Oncology

## 2023-08-31 ENCOUNTER — Inpatient Hospital Stay

## 2023-08-31 VITALS — BP 100/63 | HR 74 | Temp 98.4°F | Resp 18 | Ht 65.0 in | Wt 135.9 lb

## 2023-08-31 DIAGNOSIS — Z923 Personal history of irradiation: Secondary | ICD-10-CM | POA: Diagnosis not present

## 2023-08-31 DIAGNOSIS — D0511 Intraductal carcinoma in situ of right breast: Secondary | ICD-10-CM | POA: Insufficient documentation

## 2023-08-31 DIAGNOSIS — Z79810 Long term (current) use of selective estrogen receptor modulators (SERMs): Secondary | ICD-10-CM | POA: Insufficient documentation

## 2023-08-31 LAB — CMP (CANCER CENTER ONLY)
ALT: 11 U/L (ref 0–44)
AST: 17 U/L (ref 15–41)
Albumin: 4.1 g/dL (ref 3.5–5.0)
Alkaline Phosphatase: 47 U/L (ref 38–126)
Anion gap: 3 — ABNORMAL LOW (ref 5–15)
BUN: 16 mg/dL (ref 6–20)
CO2: 32 mmol/L (ref 22–32)
Calcium: 8.7 mg/dL — ABNORMAL LOW (ref 8.9–10.3)
Chloride: 106 mmol/L (ref 98–111)
Creatinine: 0.67 mg/dL (ref 0.44–1.00)
GFR, Estimated: >60 mL/min (ref >60)
Glucose, Bld: 83 mg/dL (ref 70–99)
Potassium: 4.3 mmol/L (ref 3.5–5.1)
Sodium: 141 mmol/L (ref 135–145)
Total Bilirubin: 0.4 mg/dL (ref 0.0–1.2)
Total Protein: 6.7 g/dL (ref 6.5–8.1)

## 2023-08-31 MED ORDER — TAMOXIFEN CITRATE 20 MG PO TABS: 20.0000 mg | ORAL_TABLET | Freq: Every day | ORAL | 3 refills | Status: AC

## 2023-08-31 NOTE — Progress Notes (Signed)
 [facility] CONSULT NOTE  Patient Care Team: Alvan Dorothyann BIRCH, MD as PCP - Diedre Loretha Ash, MD as Consulting Physician (Hematology and Oncology) Belinda Cough, MD as Consulting Physician (General Surgery) Dewey Rush, MD as Consulting Physician (Radiation Oncology)  CHIEF COMPLAINTS/PURPOSE OF CONSULTATION:  Newly diagnosed breast cancer  HISTORY OF PRESENTING ILLNESS:  Jasmine Good 58 y.o. female is here because of recent diagnosis of right breast DCIS  I reviewed her records extensively and collaborated the history with the patient.  SUMMARY OF ONCOLOGIC HISTORY: Oncology History  Ductal carcinoma in situ (DCIS) of right breast  08/30/2021 Mammogram   Diagnostic mammogram showed indeterminate linearly oriented coarse heterogeneous calcifications just lateral to the biopsy marking clip within the posterior aspect of the right breast.  X biopsy marking clip is located 0.6 cm inferior to residual calcifications.   09/20/2021 Pathology Results   Right breast lumpectomy showed DCIS, intermediate grade with calcifications discontinuously involving a fibrotic area of about 1.5 cm, no evidence of invasive carcinoma.  Resection margins are negative for DCIS, closest is a inferior margin of 0.3 cm.  Prognostics on the biopsy showed 95% positive strong staining for estrogen receptor, 90% positive strong staining for progesterone receptor.   09/20/2021 Definitive Surgery   Right breast lumpectomy with DCIS, intermediate grade with calcifications, discontinuously involving a fibrotic area of about 1.5 cm.  No evidence of invasive carcinoma.  Resection margins are negative for DCIS, closest is the inferior margin at 0.3 cm.  Prior prognostic showed ER 95% positive strong staining PR 90% positive strong staining   10/04/2021 Cancer Staging   Staging form: Breast, AJCC 8th Edition - Clinical: Stage 0 (cTis (DCIS), cN0, cM0, G2, ER+, PR+) - Signed by Loretha Ash, MD on  10/04/2021 Histologic grading system: 3 grade system   10/24/2021 - 11/18/2021 Radiation Therapy   Site Technique Total Dose (Gy) Dose per Fx (Gy) Completed Fx Beam Energies  Breast, Right: Breast_R 3D 42.56/42.56 2.66 16/16 6XFFF  Breast, Right: Breast_R_Bst specialPort 8/8 2 4/4 9E, 12E     12/2021 -  Anti-estrogen oral therapy   Tamoxifen     Interval history  The patient, with a history of breast cancer, presents for a routine follow-up. She reports compliance with tamoxifen  and has noticed a significant improvement in hot flashes, which were previously bothersome, particularly at night. She denies any vaginal bleeding.    Rest of the pertinent 10 point ROS reviewed and negative   MEDICAL HISTORY:  Past Medical History:  Diagnosis Date   Anemia    Breast cancer (HCC) 09/20/2021   Cancer (HCC) 09/12/2021   DCIS R Breast   Hemoglobin low    Personal history of radiation therapy    Uterine fibroid     SURGICAL HISTORY: Past Surgical History:  Procedure Laterality Date   BREAST LUMPECTOMY     BREAST LUMPECTOMY WITH RADIOACTIVE SEED LOCALIZATION Right 09/20/2021   Procedure: RIGHT BREAST LUMPECTOMY WITH RADIOACTIVE SEED LOCALIZATION;  Surgeon: Belinda Cough, MD;  Location: [facility];  Service: General;  Laterality: Right;   COLONOSCOPY WITH PROPOFOL  N/A 06/11/2017   Procedure: COLONOSCOPY WITH PROPOFOL ;  Surgeon: Jinny Carmine, MD;  Location: [facility];  Service: Endoscopy;  Laterality: N/A;   COLONOSCOPY WITH PROPOFOL  N/A 06/19/2022   Procedure: COLONOSCOPY WITH PROPOFOL ;  Surgeon: Jinny Carmine, MD;  Location: [facility];  Service: Endoscopy;  Laterality: N/A;   DIAGNOSTIC LAPAROSCOPY     POLYPECTOMY  06/11/2017   Procedure: POLYPECTOMY;  Surgeon: Jinny Carmine, MD;  Location: [facility];  Service: Endoscopy;;   UTERINE FIBROID SURGERY  08/06/2001    SOCIAL HISTORY: Social History   Socioeconomic History   Marital status:  Single    Spouse name: Not on file   Number of children: 0   Years of education: Not on file   Highest education level: Not on file  Occupational History    Comment: [employer]  Tobacco Use   Smoking status: Never   Smokeless tobacco: Never  Vaping Use   Vaping status: Never Used  Substance and Sexual Activity   Alcohol use: No    Alcohol/week: 0.0 standard drinks of alcohol   Drug use: No   Sexual activity: Not Currently  Other Topics Concern   Not on file  Social History Narrative   regular exercise once every 2 weeks. No regular caffeine intake.     Social Drivers of Corporate investment banker Strain: Not on file  Food Insecurity: Not on file  Transportation Needs: Not on file  Physical Activity: Sufficiently Active (09/15/2022)   Exercise Vital Sign    Days of Exercise per Week: 4 days    Minutes of Exercise per Session: 50 min  Stress: Not on file  Social Connections: Not on file  Intimate Partner Violence: Not At Risk (09/27/2021)   Humiliation, Afraid, Rape, and Kick questionnaire    Fear of Current or Ex-Partner: No    Emotionally Abused: No    Physically Abused: No    Sexually Abused: No    FAMILY HISTORY: Family History  Problem Relation Age of Onset   Heart failure Mother    Diabetes Father 50   Healthy Sister    Healthy Brother     ALLERGIES:  has no known allergies.  MEDICATIONS:  Current Outpatient Medications  Medication Sig Dispense Refill   Ferrous Sulfate (IRON) 325 (65 Fe) MG TABS Take by mouth daily.     Multiple Vitamins-Calcium (ONE-A-DAY WOMENS PO) Take by mouth daily.     tamoxifen  (NOLVADEX ) 20 MG tablet Take 1 tablet (20 mg total) by mouth daily. 90 tablet 3   No current facility-administered medications for this visit.   PHYSICAL EXAMINATION: ECOG PERFORMANCE STATUS: 0 - Asymptomatic  Vitals:   08/31/23 1152  BP: 100/63  Pulse: 74  Resp: 18  Temp: 98.4 F (36.9 C)  SpO2: 100%    Filed Weights   08/31/23 1152   Weight: 135 lb 14.4 oz (61.6 kg)    GENERAL:alert, no distress and comfortable Breast exam.  Right breast status postlumpectomy, recovered very well from surgery and radiation with excellent cosmetic outcome.  No breast masses noted.  No regional adenopathy.  Left breast normal to inspection and palpation.  No lower extremity swelling. Chest: Clear to auscultation bilaterally  LABORATORY DATA:  I have reviewed the data as listed Lab Results  Component Value Date   WBC 4.1 06/15/2021   HGB 12.5 06/15/2021   HCT 37.4 06/15/2021   MCV 91.9 06/15/2021   PLT 288 06/15/2021   Lab Results  Component Value Date   NA 139 09/15/2022   K 4.2 09/15/2022   CL 105 09/15/2022   CO2 22 09/15/2022    RADIOGRAPHIC STUDIES: I have personally reviewed the radiological reports and agreed with the findings in the report.  ASSESSMENT AND PLAN:   No problem-specific Assessment & Plan notes found for this encounter.   Total time spent: 30 minutes including history, physical, review of records,  counseling and coordination of care All questions were answered. The patient knows to call the clinic with any problems, questions or concerns.    Amber Stalls, MD 08/31/23

## 2023-08-31 NOTE — Progress Notes (Signed)
 [facility] CONSULT NOTE  Patient Care Team: Alvan Dorothyann BIRCH, MD as PCP - Diedre Loretha Ash, MD as Consulting Physician (Hematology and Oncology) Belinda Cough, MD as Consulting Physician (General Surgery) Dewey Rush, MD as Consulting Physician (Radiation Oncology)  CHIEF COMPLAINTS/PURPOSE OF CONSULTATION:  Newly diagnosed breast cancer  HISTORY OF PRESENTING ILLNESS:  Jasmine Good 58 y.o. female is here because of recent diagnosis of right breast DCIS  I reviewed her records extensively and collaborated the history with the patient.  SUMMARY OF ONCOLOGIC HISTORY: Oncology History  Ductal carcinoma in situ (DCIS) of right breast  08/30/2021 Mammogram   Diagnostic mammogram showed indeterminate linearly oriented coarse heterogeneous calcifications just lateral to the biopsy marking clip within the posterior aspect of the right breast.  X biopsy marking clip is located 0.6 cm inferior to residual calcifications.   09/20/2021 Pathology Results   Right breast lumpectomy showed DCIS, intermediate grade with calcifications discontinuously involving a fibrotic area of about 1.5 cm, no evidence of invasive carcinoma.  Resection margins are negative for DCIS, closest is a inferior margin of 0.3 cm.  Prognostics on the biopsy showed 95% positive strong staining for estrogen receptor, 90% positive strong staining for progesterone receptor.   09/20/2021 Definitive Surgery   Right breast lumpectomy with DCIS, intermediate grade with calcifications, discontinuously involving a fibrotic area of about 1.5 cm.  No evidence of invasive carcinoma.  Resection margins are negative for DCIS, closest is the inferior margin at 0.3 cm.  Prior prognostic showed ER 95% positive strong staining PR 90% positive strong staining   10/04/2021 Cancer Staging   Staging form: Breast, AJCC 8th Edition - Clinical: Stage 0 (cTis (DCIS), cN0, cM0, G2, ER+, PR+) - Signed by Loretha Ash, MD on  10/04/2021 Histologic grading system: 3 grade system   10/24/2021 - 11/18/2021 Radiation Therapy   Site Technique Total Dose (Gy) Dose per Fx (Gy) Completed Fx Beam Energies  Breast, Right: Breast_R 3D 42.56/42.56 2.66 16/16 6XFFF  Breast, Right: Breast_R_Bst specialPort 8/8 2 4/4 9E, 12E     12/2021 -  Anti-estrogen oral therapy   Tamoxifen     Discussed the use of AI scribe software for clinical note transcription with the patient, who gave verbal consent to proceed.  History of Present Illness Jasmine Good is a 58 year old female who presents for a routine follow-up.  She is currently taking tamoxifen  and experiences occasional hot flashes, which are unpredictable and sometimes sudden. Hot weather can exacerbate them.  She is also taking iron and a multivitamin. Her blood glucose levels have improved, with a recent A1c of 5.5, attributed to increased exercise and better eating habits. She exercises at least three times a week and is often active at work, contributing to her physical activity.  No vaginal bleeding, leg swelling, or shortness of breath during exercise. She has a mammogram scheduled for July and had her last liver function test in August of the previous year.  MEDICAL HISTORY:  Past Medical History:  Diagnosis Date   Anemia    Breast cancer (HCC) 09/20/2021   Cancer (HCC) 09/12/2021   DCIS R Breast   Hemoglobin low    Personal history of radiation therapy    Uterine fibroid     SURGICAL HISTORY: Past Surgical History:  Procedure Laterality Date   BREAST LUMPECTOMY     BREAST LUMPECTOMY WITH RADIOACTIVE SEED LOCALIZATION Right 09/20/2021   Procedure: RIGHT BREAST LUMPECTOMY WITH RADIOACTIVE SEED LOCALIZATION;  Surgeon: Belinda Cough, MD;  Location: [facility];  Service: General;  Laterality: Right;   COLONOSCOPY WITH PROPOFOL  N/A 06/11/2017   Procedure: COLONOSCOPY WITH PROPOFOL ;  Surgeon: Jinny Carmine, MD;  Location: [facility];   Service: Endoscopy;  Laterality: N/A;   COLONOSCOPY WITH PROPOFOL  N/A 06/19/2022   Procedure: COLONOSCOPY WITH PROPOFOL ;  Surgeon: Jinny Carmine, MD;  Location: [facility];  Service: Endoscopy;  Laterality: N/A;   DIAGNOSTIC LAPAROSCOPY     POLYPECTOMY  06/11/2017   Procedure: POLYPECTOMY;  Surgeon: Jinny Carmine, MD;  Location: [facility];  Service: Endoscopy;;   UTERINE FIBROID SURGERY  08/06/2001    SOCIAL HISTORY: Social History   Socioeconomic History   Marital status: Single    Spouse name: Not on file   Number of children: 0   Years of education: Not on file   Highest education level: Not on file  Occupational History    Comment: [employer]  Tobacco Use   Smoking status: Never   Smokeless tobacco: Never  Vaping Use   Vaping status: Never Used  Substance and Sexual Activity   Alcohol use: No    Alcohol/week: 0.0 standard drinks of alcohol   Drug use: No   Sexual activity: Not Currently  Other Topics Concern   Not on file  Social History Narrative   regular exercise once every 2 weeks. No regular caffeine intake.     Social Drivers of Corporate investment banker Strain: Not on file  Food Insecurity: Not on file  Transportation Needs: Not on file  Physical Activity: Sufficiently Active (09/15/2022)   Exercise Vital Sign    Days of Exercise per Week: 4 days    Minutes of Exercise per Session: 50 min  Stress: Not on file  Social Connections: Not on file  Intimate Partner Violence: Not At Risk (09/27/2021)   Humiliation, Afraid, Rape, and Kick questionnaire    Fear of Current or Ex-Partner: No    Emotionally Abused: No    Physically Abused: No    Sexually Abused: No    FAMILY HISTORY: Family History  Problem Relation Age of Onset   Heart failure Mother    Diabetes Father 5   Healthy Sister    Healthy Brother     ALLERGIES:  has no known allergies.  MEDICATIONS:  Current Outpatient Medications  Medication Sig Dispense Refill    Ferrous Sulfate (IRON) 325 (65 Fe) MG TABS Take by mouth daily.     Multiple Vitamins-Calcium (ONE-A-DAY WOMENS PO) Take by mouth daily.     tamoxifen  (NOLVADEX ) 20 MG tablet TAKE 1 TABLET BY MOUTH EVERY DAY 90 tablet 3   No current facility-administered medications for this visit.   PHYSICAL EXAMINATION: ECOG PERFORMANCE STATUS: 0 - Asymptomatic  Vitals:   08/31/23 1152  BP: 100/63  Pulse: 74  Resp: 18  Temp: 98.4 F (36.9 C)  SpO2: 100%    Filed Weights   08/31/23 1152  Weight: 135 lb 14.4 oz (61.6 kg)    GENERAL:alert, no distress and comfortable Breast exam.  Bilateral breasts inspected. No palpable masses. No regional adenopathy No LE edema.  LABORATORY DATA:  I have reviewed the data as listed Lab Results  Component Value Date   WBC 4.1 06/15/2021   HGB 12.5 06/15/2021   HCT 37.4 06/15/2021   MCV 91.9 06/15/2021   PLT 288 06/15/2021   Lab Results  Component Value Date   NA 139 09/15/2022   K 4.2 09/15/2022   CL 105 09/15/2022  CO2 22 09/15/2022    RADIOGRAPHIC STUDIES: I have personally reviewed the radiological reports and agreed with the findings in the report.  ASSESSMENT AND PLAN:   Assessment and Plan Assessment & Plan Breast cancer Managed with tamoxifen . Occasional hot flashes noted. Regular liver function monitoring required due to transaminitis risk. - Refilled tamoxifen  prescription until next visit. - Ordered liver function tests. Reviewed, no concerns. - Scheduled mammogram in July.  Impaired glucose tolerance Managed with lifestyle modifications. Recent HbA1c at 5.5%. - Continue exercise at least three times a week. - Maintain healthy diet.   Total time spent: 20 minutes including history, physical, review of records, counseling and coordination of care All questions were answered. The patient knows to call the clinic with any problems, questions or concerns.    Amber Stalls, MD 08/31/23

## 2023-09-03 ENCOUNTER — Ambulatory Visit
Admission: RE | Admit: 2023-09-03 | Discharge: 2023-09-03 | Disposition: A | Discharge status: Home / Self Care | Source: Ambulatory Visit | Attending: Family Medicine | Admitting: Family Medicine

## 2023-09-03 DIAGNOSIS — R92333 Mammographic heterogeneous density, bilateral breasts: Secondary | ICD-10-CM | POA: Diagnosis not present

## 2023-09-03 DIAGNOSIS — Z9889 Other specified postprocedural states: Secondary | ICD-10-CM

## 2023-09-03 DIAGNOSIS — Z853 Personal history of malignant neoplasm of breast: Secondary | ICD-10-CM | POA: Diagnosis not present

## 2023-09-03 DIAGNOSIS — Z08 Encounter for follow-up examination after completed treatment for malignant neoplasm: Secondary | ICD-10-CM | POA: Diagnosis not present

## 2023-09-16 NOTE — Progress Notes (Unsigned)
 Complete physical exam  Patient: Jasmine Good   DOB: [birth year]   58 y.o. Female  MRN: [id number]  Subjective:    No chief complaint on file.   Jasmine Good is a 58 y.o. female who presents today for a complete physical exam. She reports consuming a general diet. {types:19826} She generally feels well. She reports sleeping {DESC; WELL/FAIRLY WELL/POORLY:18703}. She {does/does not:200015} have additional problems to discuss today.    Most recent fall risk assessment:    09/15/2022    1:51 PM  Fall Risk   Falls in the past year? 1  Number falls in past yr: 0  Injury with Fall? 0  Risk for fall due to : No Fall Risks  Follow up Falls evaluation completed     Most recent depression screenings:    09/15/2022    1:51 PM 06/15/2021   11:19 AM  PHQ 2/9 Scores  PHQ - 2 Score 0 0    {VISON DENTAL STD PSA (Optional):27386}  {History (Optional):23778}  Patient Care Team: Alvan Dorothyann BIRCH, MD as PCP - Diedre Loretha Ash, MD as Consulting Physician (Hematology and Oncology) Belinda Cough, MD as Consulting Physician (General Surgery) Dewey Rush, MD as Consulting Physician (Radiation Oncology)   Outpatient Medications Prior to Visit  Medication Sig   Ferrous Sulfate (IRON) 325 (65 Fe) MG TABS Take by mouth daily.   Multiple Vitamins-Calcium (ONE-A-DAY WOMENS PO) Take by mouth daily.   tamoxifen  (NOLVADEX ) 20 MG tablet Take 1 tablet (20 mg total) by mouth daily.   No facility-administered medications prior to visit.    ROS        Objective:     LMP 02/20/2021 (Approximate)  {Vitals History (Optional):23777}  Physical Exam Constitutional:      Appearance: Normal appearance.  HENT:     Head: Normocephalic and atraumatic.     Right Ear: Tympanic membrane, ear canal and external ear normal.     Left Ear: Tympanic membrane, ear canal and external ear normal.     Nose: Nose normal.     Mouth/Throat:     Pharynx: Oropharynx is clear.  Eyes:      Extraocular Movements: Extraocular movements intact.     Conjunctiva/sclera: Conjunctivae normal.     Pupils: Pupils are equal, round, and reactive to light.  Neck:     Thyroid: No thyromegaly.  Cardiovascular:     Rate and Rhythm: Normal rate and regular rhythm.  Pulmonary:     Effort: Pulmonary effort is normal.     Breath sounds: Normal breath sounds.  Abdominal:     General: Bowel sounds are normal.     Palpations: Abdomen is soft.     Tenderness: There is no abdominal tenderness.  Musculoskeletal:        General: No swelling.     Cervical back: Neck supple.  Skin:    General: Skin is warm and dry.  Neurological:     Mental Status: She is oriented to person, place, and time.  Psychiatric:        Mood and Affect: Mood normal.        Behavior: Behavior normal.     No results found for any visits on 09/17/23. {Show previous labs (optional):23779}    Assessment & Plan:    Routine Health Maintenance and Physical Exam  Immunization History  Administered Date(s) Administered   Influenza Split 11/10/2010   Influenza, Mdck, Trivalent,PF 6+ MOS(egg free) 09/15/2022   Influenza,inj,Quad PF,6+ Mos 01/06/2013, 03/02/2015, 03/01/2016, 11/19/2017,  12/25/2019   Janssen (J&J) SARS-COV-2 Vaccination 04/20/2019   Td 07/17/2007   Tdap 11/19/2017   Zoster Recombinant(Shingrix ) 12/27/2017, 02/27/2018    Health Maintenance  Topic Date Due   Pneumococcal Vaccine: 50+ Years (1 of 2 - PCV) Never done   Hepatitis B Vaccines (1 of 3 - 19+ 3-dose series) Never done   INFLUENZA VACCINE  09/07/2023   Pneumococcal Vaccine: 19-49 Years (1 of 2 - PCV) 06/17/2024 (Originally 03/05/1984)   Cervical Cancer Screening (HPV/Pap Cotest)  06/16/2024   MAMMOGRAM  09/02/2025   DTaP/Tdap/Td (3 - Td or Tdap) 11/20/2027   Colonoscopy  06/18/2029   Hepatitis C Screening  Completed   HIV Screening  Completed   Zoster Vaccines- Shingrix   Completed   HPV VACCINES  Aged Out   Meningococcal B Vaccine  Aged  Out   COVID-19 Vaccine  Discontinued    Discussed health benefits of physical activity, and encouraged her to engage in regular exercise appropriate for her age and condition.  Problem List Items Addressed This Visit   None Visit Diagnoses       Wellness examination    -  Primary      No follow-ups on file.     Dorothyann Byars, MD

## 2023-09-17 ENCOUNTER — Ambulatory Visit (INDEPENDENT_AMBULATORY_CARE_PROVIDER_SITE_OTHER): Admitting: Family Medicine

## 2023-09-17 ENCOUNTER — Encounter: Payer: Self-pay | Admitting: Family Medicine

## 2023-09-17 VITALS — BP 103/57 | HR 75 | Ht 65.0 in | Wt 135.0 lb

## 2023-09-17 DIAGNOSIS — Z0000 Encounter for general adult medical examination without abnormal findings: Secondary | ICD-10-CM | POA: Diagnosis not present

## 2023-09-17 DIAGNOSIS — D500 Iron deficiency anemia secondary to blood loss (chronic): Secondary | ICD-10-CM | POA: Diagnosis not present

## 2023-09-17 DIAGNOSIS — Z23 Encounter for immunization: Secondary | ICD-10-CM

## 2023-09-17 DIAGNOSIS — L918 Other hypertrophic disorders of the skin: Secondary | ICD-10-CM

## 2023-09-18 ENCOUNTER — Ambulatory Visit: Payer: Self-pay | Admitting: Family Medicine

## 2023-09-18 LAB — CBC WITH DIFFERENTIAL/PLATELET
Basophils Absolute: 0 x10E3/uL (ref 0.0–0.2)
Basos: 1 %
EOS (ABSOLUTE): 0 x10E3/uL (ref 0.0–0.4)
Eos: 1 %
Hematocrit: 35.8 % (ref 34.0–46.6)
Hemoglobin: 11.8 g/dL (ref 11.1–15.9)
Immature Grans (Abs): 0 x10E3/uL (ref 0.0–0.1)
Immature Granulocytes: 0 %
Lymphocytes Absolute: 1 x10E3/uL (ref 0.7–3.1)
Lymphs: 31 %
MCH: 30.7 pg (ref 26.6–33.0)
MCHC: 33 g/dL (ref 31.5–35.7)
MCV: 93 fL (ref 79–97)
Monocytes Absolute: 0.4 x10E3/uL (ref 0.1–0.9)
Monocytes: 12 %
Neutrophils Absolute: 1.8 x10E3/uL (ref 1.4–7.0)
Neutrophils: 55 %
Platelets: 222 x10E3/uL (ref 150–450)
RBC: 3.84 x10E6/uL (ref 3.77–5.28)
RDW: 11.9 % (ref 11.7–15.4)
WBC: 3.1 x10E3/uL — ABNORMAL LOW (ref 3.4–10.8)

## 2023-09-18 LAB — IRON,TIBC AND FERRITIN PANEL
Ferritin: 205 ng/mL — ABNORMAL HIGH (ref 15–150)
Iron Saturation: 43 % (ref 15–55)
Iron: 123 ug/dL (ref 27–159)
Total Iron Binding Capacity: 287 ug/dL (ref 250–450)
UIBC: 164 ug/dL (ref 131–425)

## 2023-09-18 LAB — CALCIUM: Calcium: 9.2 mg/dL (ref 8.7–10.2)

## 2023-09-18 LAB — LIPID PANEL
Chol/HDL Ratio: 2.5 ratio (ref 0.0–4.4)
Cholesterol, Total: 152 mg/dL (ref 100–199)
HDL: 60 mg/dL (ref >39)
LDL Chol Calc (NIH): 82 mg/dL (ref 0–99)
Triglycerides: 48 mg/dL (ref 0–149)
VLDL Cholesterol Cal: 10 mg/dL (ref 5–40)

## 2023-09-18 LAB — HEMOGLOBIN A1C
Est. average glucose Bld gHb Est-mCnc: 123 mg/dL
Hgb A1c MFr Bld: 5.9 % — ABNORMAL HIGH (ref 4.8–5.6)

## 2023-09-18 NOTE — Progress Notes (Signed)
 Hi Jasmine Good, blood count overall looks good your white blood cells are little on the low side but you tend to run on the low end of normal so I think this is okay.  A1c was up compared to last time it was 5.9.  Back into the prediabetes range.  Iron levels look good.  Calcium level is normal.  Cholesterol overall looks great.

## 2024-01-13 DIAGNOSIS — Z713 Dietary counseling and surveillance: Secondary | ICD-10-CM | POA: Diagnosis not present

## 2024-01-13 DIAGNOSIS — Z6822 Body mass index (BMI) 22.0-22.9, adult: Secondary | ICD-10-CM | POA: Diagnosis not present

## 2024-02-14 ENCOUNTER — Telehealth: Payer: Self-pay | Admitting: Hematology and Oncology

## 2024-02-14 NOTE — Telephone Encounter (Signed)
 I left voicemail for patient regarding rescheduled appointment from 03/07/2024 to 03/20/2024.

## 2024-03-07 ENCOUNTER — Inpatient Hospital Stay: Admitting: Hematology and Oncology

## 2024-03-20 ENCOUNTER — Inpatient Hospital Stay: Admitting: Hematology and Oncology
